# Patient Record
Sex: Female | Born: 1985 | Hispanic: No | Marital: Married | Smoking: Never smoker
Health system: Southern US, Community
[De-identification: ages and names within clinical notes are randomized; demographics above are authoritative.]

## PROBLEM LIST (undated history)

## (undated) ENCOUNTER — Inpatient Hospital Stay: Payer: Self-pay

## (undated) DIAGNOSIS — E039 Hypothyroidism, unspecified: Secondary | ICD-10-CM

## (undated) HISTORY — PX: APPENDECTOMY: SHX54

---

## 2016-04-08 ENCOUNTER — Observation Stay
Admission: EM | Admit: 2016-04-08 | Discharge: 2016-04-08 | Disposition: A | Discharge status: Home / Self Care | Payer: Medicaid Other | Attending: Obstetrics and Gynecology | Admitting: Obstetrics and Gynecology

## 2016-04-08 ENCOUNTER — Encounter: Payer: Self-pay | Admitting: *Deleted

## 2016-04-08 DIAGNOSIS — O368130 Decreased fetal movements, third trimester, not applicable or unspecified: Secondary | ICD-10-CM | POA: Diagnosis present

## 2016-04-08 DIAGNOSIS — E039 Hypothyroidism, unspecified: Secondary | ICD-10-CM | POA: Insufficient documentation

## 2016-04-08 DIAGNOSIS — Z3A34 34 weeks gestation of pregnancy: Secondary | ICD-10-CM | POA: Insufficient documentation

## 2016-04-08 NOTE — OB Triage Note (Signed)
Presents with complaint of no fetal movement since yesterday afternoon and pain in upper abdomen that comes and goes. Denies bleeding or leaking of fluid.

## 2016-04-08 NOTE — Final Progress Note (Signed)
Physician Final Progress Note  Patient ID: Ashley Hunter MRN: [id number] DOB/AGE: 11-17-1985 30 y.o.  Admit date: 04/08/2016 Admitting provider: Conard NovakJackson, Versa Craton D, MD  Discharge date: 04/08/2016   Admission Diagnoses:  1) intrauterine pregnancy at 4275w3d  2) decreased fetal movement, 3rd trimester  Discharge Diagnoses:  1) intrauterine pregnancy at 3175w3d  2) reactive antenatal testing  History of Present Illness: The patient is a 30 y.o. female G1P0 at 7275w3d who presents for decreased fetal movement.  Denies contractions, vaginal bleeding, and leakage of fluid.  No obvious movement since yesterday.  Hospital Course: The patient had a reactive NST while at the hospital and during this time the patient began noticing good fetal movement again. She was discharged with reassurance and precautions.   Past Medical History:   Hypothyroid  Past Surgical History:  Appendectomy  Medications: levothyroxine 250mcg  Allergies: No Known Allergies  Physical Exam: BP 117/70   Pulse 94   Temp 97.3 F (36.3 C) (Oral)   Resp 18    Consults: None  Significant Findings/ Diagnostic Studies: None  Procedures: NST Baseline: 130 Variability: moderate Accelerations: present Decelerations: infrequent variable decelerations.  The overall tracing is with many accelerations and moderate variability.  The small variables noted were very shallow and short with quick return to baseline. Tocometry: mild irritability   Discharge Condition: stable  Disposition: Final discharge disposition not confirmed  Diet: Regular diet  Discharge Activity: Activity as tolerated   Allergies as of 04/08/2016   No Known Allergies     Medication List    You have not been prescribed any medications.    Follow-up Information    [facility] Follow up on 04/17/2016.   Why:  call doctor or return to labor and delivery for any concerns Contact information: [address] [place] [address] [phone]           Total time spent taking care of this patient: 30 minutes  Signed: Conard NovakJackson, Karam Dunson D, MD  04/08/2016, 2:42 PM

## 2016-04-08 NOTE — Discharge Summary (Signed)
  See FPN 

## 2016-04-17 ENCOUNTER — Observation Stay
Admission: EM | Admit: 2016-04-17 | Discharge: 2016-04-18 | Disposition: A | Discharge status: Home / Self Care | Payer: Medicaid Other | Attending: Advanced Practice Midwife | Admitting: Advanced Practice Midwife

## 2016-04-17 ENCOUNTER — Encounter: Payer: Self-pay | Admitting: *Deleted

## 2016-04-17 DIAGNOSIS — O4103X0 Oligohydramnios, third trimester, not applicable or unspecified: Secondary | ICD-10-CM | POA: Diagnosis present

## 2016-04-17 DIAGNOSIS — Z3A35 35 weeks gestation of pregnancy: Secondary | ICD-10-CM | POA: Diagnosis not present

## 2016-04-17 DIAGNOSIS — O4103X1 Oligohydramnios, third trimester, fetus 1: Secondary | ICD-10-CM | POA: Diagnosis present

## 2016-04-17 HISTORY — DX: Hypothyroidism, unspecified: E03.9

## 2016-04-17 NOTE — H&P (Signed)
OB History & Physical   History of Present Illness:  Chief Complaint: sent from office for low amniotic fluid  HPI:  Ashley Hunter is a 30 y.o. G1P0 female at 6343w5d dated by LMP consistent with 11 week ultrasound.  Her pregnancy has been complicated by hypothyroidism and BMI > 30.  .    She denies contractions.   She denies leakage of fluid.   She denies vaginal bleeding.   She reports fetal movement.    Maternal Medical History:   Past Medical History:  Diagnosis Date  . Hypothyroidism     Past Surgical History:  Procedure Laterality Date  . APPENDECTOMY      No Known Allergies  Prior to Admission medications   Medication Sig Start Date End Date Taking? Authorizing Provider  levothyroxine (SYNTHROID, LEVOTHROID) 100 MCG tablet Take 100 mcg by mouth daily before breakfast.   Yes Historical Provider, MD  Prenatal Vit-Fe Fumarate-FA (PRENATAL MULTIVITAMIN) TABS tablet Take 1 tablet by mouth daily at 12 noon.   Yes Historical Provider, MD    OB History  Gravida Para Term Preterm AB Living  1            SAB TAB Ectopic Multiple Live Births               # Outcome Date GA Lbr Len/2nd Weight Sex Delivery Anes PTL Lv  1 Current               Prenatal care site: [facility]  Social History: She  reports that she has never smoked. She has never used smokeless tobacco. She reports that she does not drink alcohol or use drugs.  Family History: family history includes Diabetes in her paternal grandfather.   Review of Systems: Negative x 10 systems reviewed except as noted in the HPI.    Physical Exam:  Vital Signs: BP 110/72   Pulse 81   Temp 97.7 F (36.5 C) (Oral)  General: no acute distress.  HEENT: normocephalic, atraumatic Heart: regular rate & rhythm.  No murmurs/rubs/gallops Lungs: clear to auscultation bilaterally Abdomen: soft, gravid, non-tender;  EFW: 5 lb 2 oz. Pelvic: declined Extremities: non-tender, symmetric, no edema bilaterally.   DTRs: 2+  Neurologic: Alert & oriented x 3.    Pertinent Results:  Prenatal Labs: Blood type/Rh B positive  Antibody screen negative  Rubella Immune  Varicella Immune    RPR NR  HBsAg negative  HIV negative  GC negative  Chlamydia negative  Genetic screening declined  1 hour GTT Early 129, 28 week 140 (passed 2 hour gtt with only fasting abnormal - 125)  3 hour GTT See above  GBS unknown   Baseline FHR: 130 beats/min   Variability: moderate   Accelerations: present   Decelerations: absent Contractions: absent frequency: n/a Overall assessment: category 1  Bedside Ultrasound: (performed in clinic today)  Number of Fetus: 1  Presentation: breech  Fluid: 3.6 cm (1 > 2cm pocket of fluid)  Placental Location: posterior  Assessment:  Ashley PewHarshana Jayeshkumar Goslin is a 30 y.o. G1P0 female at 7443w5d with oligomenorrhea..   Plan:  1. Admit to Labor & Delivery for observation. 2. Fetwal well-being: reassuring 3. Oligohydramnios: 1. Hydrate overnight 2. Repeat AFI late morning/early afternoon 3. Based on definition of oligohydramnios (AFI <5.0 or single deepest vertical pocket < 2.0); she technically has one pocket of >2.0 cm of fluid. Delivery consideration to be based on tomorrow's evaluation.  From ACOG, recommendation is to try to get to 7736  and if possible 37 weeks prior to delivery.   4. Delivery mode will likely have to be by cesarean.   5. All questions answered.   Conard NovakJackson, Tiarah Shisler D, MD 04/17/2016 8:07 PM

## 2016-04-17 NOTE — OB Triage Note (Signed)
G1P0 presents at 7433w5d for NST for low AFI in office.

## 2016-04-17 NOTE — Plan of Care (Signed)
Report to next RN/C Zia Najera RNC 

## 2016-04-18 ENCOUNTER — Observation Stay: Payer: Medicaid Other

## 2016-04-18 DIAGNOSIS — O4103X0 Oligohydramnios, third trimester, not applicable or unspecified: Secondary | ICD-10-CM | POA: Diagnosis not present

## 2016-04-18 NOTE — Discharge Summary (Signed)
Pt is a 30 yo G1 with EDD of 05/17/16 admitted yesterday for IV hydration at 6333w5d with AFI of 3.6cm. AFI this am up to 4.7 cm with MVP 2.1cm. Dr Tiburcio PeaHarris in to speak with pt re: plan of management. As fetal status is very reassuring and AFI improved slightly with MVP >2cm, will continue to monitor closely with AFIs every 48 hours. Plan for repeat AFI on 12/29 at office. Dr Tiburcio PeaHarris reviewed importance of [facility] and pt was instructed to return to hospital with any change in movement. Also reviewed labor precautions, return with contractions, LOF or vaginal bleeding. Pt in agreement with plan of management. Will be d/c home soon.

## 2016-04-22 ENCOUNTER — Inpatient Hospital Stay
Admission: EM | Admit: 2016-04-22 | Discharge: 2016-04-22 | Disposition: A | Discharge status: Home / Self Care | Payer: Medicaid Other | Attending: Obstetrics & Gynecology | Admitting: Obstetrics & Gynecology

## 2016-04-22 DIAGNOSIS — Z0371 Encounter for suspected problem with amniotic cavity and membrane ruled out: Secondary | ICD-10-CM | POA: Diagnosis present

## 2016-04-22 DIAGNOSIS — Z3A36 36 weeks gestation of pregnancy: Secondary | ICD-10-CM | POA: Insufficient documentation

## 2016-04-22 DIAGNOSIS — O321XX0 Maternal care for breech presentation, not applicable or unspecified: Secondary | ICD-10-CM | POA: Insufficient documentation

## 2016-04-22 NOTE — Final Progress Note (Signed)
Physician Final Progress Note  Patient ID: Ashley Hunter MRN: [id number] DOB/AGE: 1985-10-07 30 y.o.  Admit date: 04/22/2016 Admitting provider: Antionette CharLisa Jackson-Moore, MD Discharge date: 04/22/2016   Admission Diagnoses: IUP at 36.3 weeks with borderline low amniotic fluid Breech presentation  Discharge Diagnoses:  Same as above Reactive NST Normal amniotic fluid  Consults: none  Significant Findings/ Diagnostic Studies: 30 yo G1 P0 female with EDC=05/17/2016 presented at 36 wk 3d  for a NST and AFI. She had a AFI on 04/17/2016 of 3.6cm and received IV hydration on L&D. Repeat AFI 12/27=4.7cm with one pocket>2cm. On 12/29 her AFI=3.8cm with one pocket>2cm. Non stress tests have remained reactive. Baby has been in the breech presentation and she will need a Cesarean section for delivery. Nonstress test today is nicely reactive with baseline 120s and accelerations to 150s to 180s, moderate variability. AFI=1.48cm+ 3..09cm+ 1.23 cm= 5.8cm. Baby is still in breech presentation. Patient is tentatively scheduled for a Cesarean section on 5 January. Follow up for a non stress test and AFI on 3 January [facility].  Procedures: Non stress test and AFI  Discharge Condition: stable  Disposition: 01-Home or Self Care  Diet: Regular diet  Discharge Activity: Activity as tolerated     Total time spent taking care of this patient: 15 minutes  Signed: Jasara Corrigan 04/22/2016, 10:57 AM

## 2016-04-22 NOTE — OB Triage Note (Signed)
Here for repeat NST and AFI. Ashley HoopsElks, [name]

## 2016-04-25 ENCOUNTER — Other Ambulatory Visit (HOSPITAL_COMMUNITY): Payer: Self-pay | Admitting: Pharmacy Technician

## 2016-04-26 ENCOUNTER — Encounter
Admission: RE | Admit: 2016-04-26 | Discharge: 2016-04-26 | Disposition: A | Discharge status: Home / Self Care | Payer: Medicaid Other | Source: Ambulatory Visit | Attending: Obstetrics and Gynecology | Admitting: Obstetrics and Gynecology

## 2016-04-26 DIAGNOSIS — Z01812 Encounter for preprocedural laboratory examination: Secondary | ICD-10-CM | POA: Insufficient documentation

## 2016-04-26 DIAGNOSIS — O321XX0 Maternal care for breech presentation, not applicable or unspecified: Secondary | ICD-10-CM

## 2016-04-26 DIAGNOSIS — Z0183 Encounter for blood typing: Secondary | ICD-10-CM | POA: Insufficient documentation

## 2016-04-26 DIAGNOSIS — Z3A00 Weeks of gestation of pregnancy not specified: Secondary | ICD-10-CM

## 2016-04-26 LAB — CBC
HCT: 36.6 % (ref 35.0–47.0)
Hemoglobin: 12.5 g/dL (ref 12.0–16.0)
MCH: 29.6 pg (ref 26.0–34.0)
MCHC: 34.2 g/dL (ref 32.0–36.0)
MCV: 86.6 fL (ref 80.0–100.0)
PLATELETS: 247 10*3/uL (ref 150–440)
RBC: 4.22 MIL/uL (ref 3.80–5.20)
RDW: 13.6 % (ref 11.5–14.5)
WBC: 10.7 10*3/uL (ref 3.6–11.0)

## 2016-04-26 LAB — RAPID HIV SCREEN (HIV 1/2 AB+AG)
HIV 1/2 Antibodies: NONREACTIVE
HIV-1 P24 Antigen - HIV24: NONREACTIVE

## 2016-04-26 LAB — TYPE AND SCREEN
ABO/RH(D): B POS
Antibody Screen: NEGATIVE
EXTEND SAMPLE REASON: UNDETERMINED

## 2016-04-26 NOTE — Patient Instructions (Signed)
Your procedure is scheduled on: 04/28/15 Report to [facility] 3RD FLOOR VISITOR ENTRANCE AT 10:00 AM   Remember: Instructions that are not followed completely may result in serious medical risk, up to and including death, or upon the discretion of your surgeon and anesthesiologist your surgery may need to be rescheduled.    __X__ 1. Do not eat food or drink liquids after midnight. No gum chewing or hard candies.     __X__ 2. No Alcohol for 24 hours before or after surgery.   ____ 3. Bring all medications with you on the day of surgery if instructed.    __X__ 4. Notify your doctor if there is any change in your medical condition     (cold, fever, infections).             __X___5. No smoking within 24 hours of your surgery.     Do not wear jewelry, make-up, hairpins, clips or nail polish.  Do not wear lotions, powders, or perfumes.   Do not shave 48 hours prior to surgery. Men may shave face and neck.  Do not bring valuables to the hospital.    [facility] is not responsible for any belongings or valuables.               Contacts, dentures or bridgework may not be worn into surgery.  Leave your suitcase in the car. After surgery it may be brought to your room.  For patients admitted to the hospital, discharge time is determined by your                treatment team.   Patients discharged the day of surgery will not be allowed to drive home.   Please read over the following fact sheets that you were given:   Pain Booklet and MRSA Information   __X__ Take these medicines the morning of surgery with A SIP OF WATER:    1. LEVOTHYROXINE  2.   3.   4.  5.  6.  ____ Fleet Enema (as directed)   __X__ Use CHG Soap as directed  ____ Use inhalers on the day of surgery  ____ Stop metformin 2 days prior to surgery    ____ Take 1/2 of usual insulin dose the night before surgery and none on the morning of surgery.   ____ Stop Coumadin/Plavix/aspirin on   __X__ Stop  Anti-inflammatories such as Advil, Aleve, Ibuprofen, Motrin, Naproxen, Naprosyn, Goodies,powder, or aspirin products.  OK to take Tylenol.   ____ Stop supplements until after surgery.    ____ Bring C-Pap to the hospital.

## 2016-04-27 ENCOUNTER — Encounter
Admission: RE | Disposition: A | Discharge status: Home / Self Care | Payer: Self-pay | Source: Ambulatory Visit | Attending: Obstetrics and Gynecology

## 2016-04-27 ENCOUNTER — Inpatient Hospital Stay
Admission: RE | Admit: 2016-04-27 | Discharge: 2016-04-30 | DRG: 765 | Disposition: A | Discharge status: Home / Self Care | Payer: Medicaid Other | Source: Ambulatory Visit | Attending: Obstetrics and Gynecology | Admitting: Obstetrics and Gynecology

## 2016-04-27 ENCOUNTER — Observation Stay: Payer: Medicaid Other | Admitting: Anesthesiology

## 2016-04-27 DIAGNOSIS — E039 Hypothyroidism, unspecified: Secondary | ICD-10-CM | POA: Diagnosis present

## 2016-04-27 DIAGNOSIS — O9081 Anemia of the puerperium: Secondary | ICD-10-CM | POA: Diagnosis not present

## 2016-04-27 DIAGNOSIS — O99284 Endocrine, nutritional and metabolic diseases complicating childbirth: Secondary | ICD-10-CM | POA: Diagnosis present

## 2016-04-27 DIAGNOSIS — O4100X0 Oligohydramnios, unspecified trimester, not applicable or unspecified: Secondary | ICD-10-CM | POA: Diagnosis present

## 2016-04-27 DIAGNOSIS — Z98891 History of uterine scar from previous surgery: Secondary | ICD-10-CM

## 2016-04-27 DIAGNOSIS — D62 Acute posthemorrhagic anemia: Secondary | ICD-10-CM | POA: Diagnosis not present

## 2016-04-27 DIAGNOSIS — O4103X0 Oligohydramnios, third trimester, not applicable or unspecified: Secondary | ICD-10-CM | POA: Diagnosis present

## 2016-04-27 DIAGNOSIS — Z3A37 37 weeks gestation of pregnancy: Secondary | ICD-10-CM | POA: Diagnosis not present

## 2016-04-27 DIAGNOSIS — O321XX0 Maternal care for breech presentation, not applicable or unspecified: Secondary | ICD-10-CM | POA: Diagnosis present

## 2016-04-27 LAB — RPR: RPR Ser Ql: NONREACTIVE

## 2016-04-27 LAB — ABO/RH: ABO/RH(D): B POS

## 2016-04-27 SURGERY — Surgical Case
Anesthesia: Spinal | Site: Abdomen | Wound class: Clean

## 2016-04-27 MED ORDER — LEVOTHYROXINE SODIUM 50 MCG PO TABS
100.0000 ug | ORAL_TABLET | Freq: Every day | ORAL | Status: DC
  Administered 2016-04-28 – 2016-04-30 (×3): 100 ug via ORAL
  Filled 2016-04-27 (×3): qty 2

## 2016-04-27 MED ORDER — OXYCODONE HCL 5 MG PO TABS
10.0000 mg | ORAL_TABLET | ORAL | Status: DC | PRN
  Administered 2016-04-28: 10 mg via ORAL
  Filled 2016-04-27 (×3): qty 2

## 2016-04-27 MED ORDER — SCOPOLAMINE 1 MG/3DAYS TD PT72: 1.0000 | MEDICATED_PATCH | Freq: Once | TRANSDERMAL | Status: DC

## 2016-04-27 MED ORDER — OXYCODONE-ACETAMINOPHEN 5-325 MG PO TABS: 2.0000 | ORAL_TABLET | ORAL | Status: DC | PRN

## 2016-04-27 MED ORDER — CEFAZOLIN SODIUM-DEXTROSE 2-4 GM/100ML-% IV SOLN
2.0000 g | INTRAVENOUS | Status: AC
  Administered 2016-04-27: 2 g via INTRAVENOUS
  Filled 2016-04-27 (×2): qty 100

## 2016-04-27 MED ORDER — ONDANSETRON HCL 4 MG/2ML IJ SOLN
INTRAMUSCULAR | Status: DC | PRN
  Administered 2016-04-27: 4 mg via INTRAVENOUS

## 2016-04-27 MED ORDER — OXYTOCIN 40 UNITS IN LACTATED RINGERS INFUSION - SIMPLE MED
INTRAVENOUS | Status: DC | PRN
  Administered 2016-04-27: 700 mL via INTRAVENOUS

## 2016-04-27 MED ORDER — SIMETHICONE 80 MG PO CHEW
80.0000 mg | CHEWABLE_TABLET | Freq: Three times a day (TID) | ORAL | Status: DC
  Administered 2016-04-27 – 2016-04-30 (×8): 80 mg via ORAL
  Filled 2016-04-27 (×9): qty 1

## 2016-04-27 MED ORDER — SODIUM CHLORIDE 0.9% FLUSH: 3.0000 mL | INTRAVENOUS | Status: DC | PRN

## 2016-04-27 MED ORDER — FENTANYL CITRATE (PF) 100 MCG/2ML IJ SOLN
INTRAMUSCULAR | Status: DC | PRN
  Administered 2016-04-27: 15 ug via INTRATHECAL

## 2016-04-27 MED ORDER — OXYTOCIN 40 UNITS IN LACTATED RINGERS INFUSION - SIMPLE MED
2.5000 [IU]/h | INTRAVENOUS | Status: AC
Start: 2016-04-27 — End: 2016-04-28
  Filled 2016-04-27: qty 1000

## 2016-04-27 MED ORDER — FENTANYL CITRATE (PF) 100 MCG/2ML IJ SOLN: 25.0000 ug | INTRAMUSCULAR | Status: DC | PRN

## 2016-04-27 MED ORDER — ONDANSETRON HCL 4 MG/2ML IJ SOLN
INTRAMUSCULAR | Status: AC
  Filled 2016-04-27: qty 2

## 2016-04-27 MED ORDER — BUPIVACAINE HCL 0.5 % IJ SOLN
INTRAMUSCULAR | Status: DC | PRN
  Administered 2016-04-27: 10 mL

## 2016-04-27 MED ORDER — BUPIVACAINE HCL (PF) 0.5 % IJ SOLN: 5.0000 mL | Freq: Once | INTRAMUSCULAR | Status: DC

## 2016-04-27 MED ORDER — PRENATAL MULTIVITAMIN CH: 1.0000 | ORAL_TABLET | Freq: Every day | ORAL | Status: DC

## 2016-04-27 MED ORDER — ACETAMINOPHEN 325 MG PO TABS
650.0000 mg | ORAL_TABLET | Freq: Four times a day (QID) | ORAL | Status: DC
  Administered 2016-04-27 – 2016-04-30 (×9): 650 mg via ORAL
  Filled 2016-04-27 (×11): qty 2

## 2016-04-27 MED ORDER — PRENATAL MULTIVITAMIN CH
1.0000 | ORAL_TABLET | Freq: Every day | ORAL | Status: DC
  Administered 2016-04-28 – 2016-04-29 (×2): 1 via ORAL
  Filled 2016-04-27 (×3): qty 1

## 2016-04-27 MED ORDER — BUPIVACAINE HCL (PF) 0.5 % IJ SOLN
5.0000 mL | Freq: Once | INTRAMUSCULAR | Status: DC
  Filled 2016-04-27: qty 30

## 2016-04-27 MED ORDER — OXYCODONE-ACETAMINOPHEN 5-325 MG PO TABS: 1.0000 | ORAL_TABLET | ORAL | Status: DC | PRN

## 2016-04-27 MED ORDER — FENTANYL CITRATE (PF) 100 MCG/2ML IJ SOLN
INTRAMUSCULAR | Status: AC
  Filled 2016-04-27: qty 2

## 2016-04-27 MED ORDER — ONDANSETRON HCL 4 MG/2ML IJ SOLN: 4.0000 mg | Freq: Three times a day (TID) | INTRAMUSCULAR | Status: DC | PRN

## 2016-04-27 MED ORDER — DIPHENHYDRAMINE HCL 50 MG/ML IJ SOLN: 12.5000 mg | INTRAMUSCULAR | Status: DC | PRN

## 2016-04-27 MED ORDER — ONDANSETRON HCL 4 MG/2ML IJ SOLN: 4.0000 mg | Freq: Once | INTRAMUSCULAR | Status: DC | PRN

## 2016-04-27 MED ORDER — LACTATED RINGERS IV SOLN
INTRAVENOUS | Status: DC
  Administered 2016-04-27 – 2016-04-28 (×2): via INTRAVENOUS

## 2016-04-27 MED ORDER — NALBUPHINE HCL 10 MG/ML IJ SOLN: 5.0000 mg | INTRAMUSCULAR | Status: DC | PRN

## 2016-04-27 MED ORDER — BUPIVACAINE IN DEXTROSE 0.75-8.25 % IT SOLN
INTRATHECAL | Status: DC | PRN
  Administered 2016-04-27: 1.6 mL via INTRATHECAL

## 2016-04-27 MED ORDER — IBUPROFEN 600 MG PO TABS: 600.0000 mg | ORAL_TABLET | Freq: Four times a day (QID) | ORAL | Status: DC | PRN

## 2016-04-27 MED ORDER — FERROUS SULFATE 325 (65 FE) MG PO TABS
325.0000 mg | ORAL_TABLET | Freq: Two times a day (BID) | ORAL | Status: DC
Start: 2016-04-27 — End: 2016-04-30
  Administered 2016-04-27 – 2016-04-30 (×6): 325 mg via ORAL
  Filled 2016-04-27 (×6): qty 1

## 2016-04-27 MED ORDER — NALOXONE HCL 0.4 MG/ML IJ SOLN: 0.4000 mg | INTRAMUSCULAR | Status: DC | PRN

## 2016-04-27 MED ORDER — MEPERIDINE HCL 25 MG/ML IJ SOLN: 6.2500 mg | INTRAMUSCULAR | Status: DC | PRN

## 2016-04-27 MED ORDER — MORPHINE SULFATE (PF) 0.5 MG/ML IJ SOLN
INTRAMUSCULAR | Status: AC
  Filled 2016-04-27: qty 10

## 2016-04-27 MED ORDER — NALOXONE HCL 2 MG/2ML IJ SOSY
1.0000 ug/kg/h | PREFILLED_SYRINGE | INTRAVENOUS | Status: DC | PRN
  Filled 2016-04-27: qty 2

## 2016-04-27 MED ORDER — SENNOSIDES-DOCUSATE SODIUM 8.6-50 MG PO TABS
2.0000 | ORAL_TABLET | ORAL | Status: DC
  Administered 2016-04-30: 2 via ORAL
  Filled 2016-04-27 (×2): qty 2

## 2016-04-27 MED ORDER — MENTHOL 3 MG MT LOZG
1.0000 | LOZENGE | OROMUCOSAL | Status: DC | PRN
  Filled 2016-04-27: qty 9

## 2016-04-27 MED ORDER — DIBUCAINE 1 % RE OINT: 1.0000 "application " | TOPICAL_OINTMENT | RECTAL | Status: DC | PRN

## 2016-04-27 MED ORDER — NALBUPHINE HCL 10 MG/ML IJ SOLN: 5.0000 mg | Freq: Once | INTRAMUSCULAR | Status: DC | PRN

## 2016-04-27 MED ORDER — LACTATED RINGERS IV SOLN: INTRAVENOUS | Status: DC

## 2016-04-27 MED ORDER — MORPHINE SULFATE (PF) 0.5 MG/ML IJ SOLN
INTRAMUSCULAR | Status: DC | PRN
  Administered 2016-04-27: .1 mg via INTRATHECAL

## 2016-04-27 MED ORDER — DIPHENHYDRAMINE HCL 25 MG PO CAPS: 25.0000 mg | ORAL_CAPSULE | Freq: Four times a day (QID) | ORAL | Status: DC | PRN

## 2016-04-27 MED ORDER — BUPIVACAINE ON-Q PAIN PUMP (FOR ORDER SET NO CHG)
INJECTION | Status: DC
  Filled 2016-04-27: qty 1

## 2016-04-27 MED ORDER — DIPHENHYDRAMINE HCL 25 MG PO CAPS: 25.0000 mg | ORAL_CAPSULE | ORAL | Status: DC | PRN

## 2016-04-27 MED ORDER — SOD CITRATE-CITRIC ACID 500-334 MG/5ML PO SOLN
30.0000 mL | ORAL | Status: AC
  Administered 2016-04-27: 30 mL via ORAL
  Filled 2016-04-27: qty 15

## 2016-04-27 MED ORDER — LACTATED RINGERS IV SOLN
INTRAVENOUS | Status: DC | PRN
Start: 2016-04-27 — End: 2016-04-27
  Administered 2016-04-27: 12:00:00 via INTRAVENOUS

## 2016-04-27 MED ORDER — IBUPROFEN 600 MG PO TABS: 600.0000 mg | ORAL_TABLET | Freq: Four times a day (QID) | ORAL | Status: DC

## 2016-04-27 MED ORDER — IBUPROFEN 600 MG PO TABS
600.0000 mg | ORAL_TABLET | Freq: Four times a day (QID) | ORAL | Status: DC
  Administered 2016-04-27 – 2016-04-30 (×10): 600 mg via ORAL
  Filled 2016-04-27 (×12): qty 1

## 2016-04-27 MED ORDER — LACTATED RINGERS IV BOLUS (SEPSIS)
1000.0000 mL | Freq: Once | INTRAVENOUS | Status: AC
  Administered 2016-04-27: 1000 mL via INTRAVENOUS

## 2016-04-27 MED ORDER — WITCH HAZEL-GLYCERIN EX PADS: 1.0000 "application " | MEDICATED_PAD | CUTANEOUS | Status: DC | PRN

## 2016-04-27 MED ORDER — COCONUT OIL OIL: 1.0000 "application " | TOPICAL_OIL | Status: DC | PRN

## 2016-04-27 MED ORDER — EPHEDRINE SULFATE 50 MG/ML IJ SOLN
INTRAMUSCULAR | Status: DC | PRN
  Administered 2016-04-27 (×2): 10 mg via INTRAVENOUS

## 2016-04-27 MED ORDER — PHENYLEPHRINE HCL 10 MG/ML IJ SOLN
INTRAMUSCULAR | Status: AC
  Filled 2016-04-27: qty 1

## 2016-04-27 MED ORDER — PHENYLEPHRINE HCL 10 MG/ML IJ SOLN
INTRAMUSCULAR | Status: DC | PRN
  Administered 2016-04-27 (×5): 100 ug via INTRAVENOUS
  Administered 2016-04-27: 50 ug via INTRAVENOUS
  Administered 2016-04-27 (×3): 100 ug via INTRAVENOUS

## 2016-04-27 MED ORDER — OXYCODONE HCL 5 MG PO TABS
5.0000 mg | ORAL_TABLET | ORAL | Status: DC | PRN
  Administered 2016-04-28 – 2016-04-30 (×2): 5 mg via ORAL
  Filled 2016-04-27 (×2): qty 1

## 2016-04-27 MED ORDER — BUPIVACAINE 0.25 % ON-Q PUMP DUAL CATH 400 ML
400.0000 mL | INJECTION | Status: DC
  Filled 2016-04-27: qty 400

## 2016-04-27 SURGICAL SUPPLY — 30 items
CANISTER SUCT 3000ML (MISCELLANEOUS) ×2 IMPLANT
CATH KIT ON-Q SILVERSOAK 5IN (CATHETERS) ×4 IMPLANT
DERMABOND ADVANCED (GAUZE/BANDAGES/DRESSINGS) ×1
DERMABOND ADVANCED .7 DNX12 (GAUZE/BANDAGES/DRESSINGS) ×1 IMPLANT
DRSG OPSITE POSTOP 4X10 (GAUZE/BANDAGES/DRESSINGS) ×2 IMPLANT
DRSG TELFA 3X8 NADH (GAUZE/BANDAGES/DRESSINGS) ×2 IMPLANT
ELECT CAUTERY BLADE 6.4 (BLADE) ×2 IMPLANT
ELECT REM PT RETURN 9FT ADLT (ELECTROSURGICAL) ×2
ELECTRODE REM PT RTRN 9FT ADLT (ELECTROSURGICAL) ×1 IMPLANT
GAUZE SPONGE 4X4 12PLY STRL (GAUZE/BANDAGES/DRESSINGS) ×2 IMPLANT
GLOVE BIO SURGEON STRL SZ7 (GLOVE) ×2 IMPLANT
GLOVE INDICATOR 7.5 STRL GRN (GLOVE) ×2 IMPLANT
GOWN STRL REUS W/ TWL LRG LVL3 (GOWN DISPOSABLE) ×3 IMPLANT
GOWN STRL REUS W/TWL LRG LVL3 (GOWN DISPOSABLE) ×3
LIQUID BAND (GAUZE/BANDAGES/DRESSINGS) ×2 IMPLANT
NS IRRIG 1000ML POUR BTL (IV SOLUTION) ×2 IMPLANT
PACK C SECTION AR (MISCELLANEOUS) ×2 IMPLANT
PAD OB MATERNITY 4.3X12.25 (PERSONAL CARE ITEMS) ×4 IMPLANT
PAD PREP 24X41 OB/GYN DISP (PERSONAL CARE ITEMS) ×2 IMPLANT
SPONGE LAP 18X18 5 PK (GAUZE/BANDAGES/DRESSINGS) ×4 IMPLANT
STRIP CLOSURE SKIN 1/2X4 (GAUZE/BANDAGES/DRESSINGS) ×2 IMPLANT
SUT CHROMIC GUT BROWN 0 54 (SUTURE) ×1 IMPLANT
SUT CHROMIC GUT BROWN 0 54IN (SUTURE) ×2
SUT MNCRL 4-0 (SUTURE) ×1
SUT MNCRL 4-0 27XMFL (SUTURE) ×1
SUT PDS AB 1 TP1 96 (SUTURE) ×2 IMPLANT
SUT PLAIN 2 0 XLH (SUTURE) ×2 IMPLANT
SUT VIC AB 0 CT1 36 (SUTURE) ×8 IMPLANT
SUTURE MNCRL 4-0 27XMF (SUTURE) ×1 IMPLANT
SWABSTK COMLB BENZOIN TINCTURE (MISCELLANEOUS) ×2 IMPLANT

## 2016-04-27 NOTE — Anesthesia Procedure Notes (Signed)
Spinal  Patient location during procedure: OR Staffing Anesthesiologist: Marline Backbone F Resident/CRNA: Rolla Plate Performed: resident/CRNA  Preanesthetic Checklist Completed: patient identified, site marked, surgical consent, pre-op evaluation, timeout performed, IV checked, risks and benefits discussed and monitors and equipment checked Spinal Block Patient position: sitting Prep: ChloraPrep and site prepped and draped Patient monitoring: heart rate, continuous pulse ox, blood pressure and cardiac monitor Approach: midline Location: L4-5 Injection technique: single-shot Needle Needle type: Introducer and Pencan  Needle gauge: 24 G Needle length: 9 cm Additional Notes Negative paresthesia. Negative blood return. Positive free-flowing CSF. Expiration date of kit checked and confirmed. Patient tolerated procedure well, without complications.

## 2016-04-27 NOTE — Transfer of Care (Signed)
Immediate Anesthesia Transfer of Care Note  Patient: Ashley Hunter  Procedure(s) Performed: Procedure(s) with comments: CESAREAN SECTION FOR BREECH PRESENTATION (N/A) - Baby girl born @ 1300 Apgars: 8/9 Weight: 5lbs 9ozs  Patient Location: Mother/Baby  Anesthesia Type:Spinal  Level of Consciousness: awake and alert   Airway & Oxygen Therapy: Patient Spontanous Breathing  Post-op Assessment: Report given to RN and Post -op Vital signs reviewed and stable  Post vital signs: Reviewed  Last Vitals:  Vitals:   04/27/16 1030 04/27/16 1404  BP: 123/77 (!) 96/58  Pulse: 79 71  Resp: 16 18  Temp: 36.7 C 36.2 C    Last Pain:  Vitals:   04/27/16 1030  TempSrc: Oral  PainSc: 0-No pain         Complications: No apparent anesthesia complications

## 2016-04-27 NOTE — Op Note (Signed)
Cesarean Section Operative Note    Newt LukesHarshana J Boutin   04/27/2016   Pre-operative Diagnosis:  1) intrauterine pregnancy at 815w1d  2) breech presentation 3) oligohydramnios.   Post-operative Diagnosis:  1) intrauterine pregnancy at [redacted]w[redacted]d  2) breech presentation 3) oligohydramnios.   Procedure: Primary Low Transverse Cesarean Section via Pfannenstiel incision  Surgeon: Surgeon(s) and Role:    * Conard NovakStephen D Vinayak Bobier, MD - Primary   Assistants: Gwenith SpitzJaime Benson, RN  Anesthesia: spinal   Findings:  1) normal appearing gravid uterus, fallopian tubes, and ovaries 2) viable female infant with APGARs 8 and 9 and weight 2,530 grams   Estimated Blood Loss: 750 mL  Total IV Fluids: 1,300 ml crystalloid  Urine Output: 150 mL clear urine at end of procedure  Specimens: None  Complications: no complications  Disposition: PACU - hemodynamically stable.   Maternal Condition: stable   Baby condition / location:  Couplet care / Skin to Skin  Procedure Details:  The patient was seen in the Holding Room. The risks, benefits, complications, treatment options, and expected outcomes were discussed with the patient. The patient concurred with the proposed plan, giving informed consent. identified as Newt LukesHarshana J Umeda and the procedure verified as C-Section Delivery. A Time Out was held and the above information confirmed.   After induction of anesthesia, the patient was draped and prepped in the usual sterile manner. A Pfannenstiel incision was made and carried down through the subcutaneous tissue to the fascia. Fascial incision was made and extended transversely. The fascia was separated from the underlying rectus tissue superiorly and inferiorly. The peritoneum was identified and entered. Peritoneal incision was extended longitudinally. The bladder flap was not freed from the lower uterine segment. A low transverse uterine incision was made and the hysterotomy was extended with cranial-caudal tension.  Delivered from breech (frank) presentation was a 2,530 gram Living newborn infant(s)/Female (delivered by with Mauriceau Smellie veit maneuver with Apgar scores of 8 at one minute and 9 at five minutes. Cord ph was not sent the umbilical cord was clamped and cut cord blood was not obtained for evaluation. The placenta was removed Intact and appeared normal. The uterine outline, tubes and ovaries appeared normal. The uterine incision was closed with running locked sutures of 0 Vicryl.  A second layer of the same suture was thrown in an imbricating fashion.  Hemostasis was assured.  The uterus was returned to the abdomen and the paracolic gutters were cleared of all clots and debris.  The rectus muscles were inspected and found to be hemostatic.  The On-Q catheter pumps were inserted in accordance with the manufacturer's recommendations.  The catheters were inserted approximately 4cm cephelad to the incision line, approximately 1cm apart, straddling the midline.  They were inserted to a depth of the 4th mark. They were positioned superficial to the rectus abdominus muscles and deep to the rectus fascia.    The fascia was then reapproximated with running sutures of 1-0 PDS, looped. The subcuticular closure was performed using 4-0 monocryl. The skin closure was reinforced using surgical skin glue.  The On-Q catheters were bolused with 5 mL of 0.5% marcaine plain for a total of 10 mL.  The catheters were affixed to the skin with surgical skin glue, steri-strips, and tegaderm.    Instrument, sponge, and needle counts were correct prior the abdominal closure and were correct at the conclusion of the case.  The patient received Ancef 2 gram IV prior to skin incision (within 30 minutes). For VTE prophylaxis  she was wearing SCDs throughout the case.   Signed: Conard Novak, MD 04/27/2016 1:51 PM

## 2016-04-27 NOTE — Discharge Summary (Signed)
OB Discharge Summary     Patient Name: Ashley Hunter DOB: 1985/07/05 MRN: [id number]  Date of admission: 04/27/2016 Delivering MD: Conard NovakJackson, Stephen D, MD  Date of Delivery: 04/27/2016  Date of discharge: 04/30/2016  Admitting diagnosis: breech,oligohydramnios Intrauterine pregnancy: 6064w1d     Secondary diagnosis: Hypothyroid     Discharge diagnosis: Term Pregnancy Delivered                                                                                                Post partum procedures:none  Augmentation: N/A  Complications: None  Hospital course:  Sceduled C/S   31 y.o. yo G1P0 at 9764w1d was admitted to the hospital 04/27/2016 for scheduled cesarean section with the following indication:Malpresentation.  Membrane Rupture Time/Date:   ,    Patient delivered a Viable infant.04/27/2016  Details of operation can be found in separate operative note.  Pateint had an uncomplicated postpartum course.  She is ambulating, tolerating a regular diet, passing flatus, and urinating well. Patient is discharged home in stable condition on  04/30/16          Physical exam  Vitals:   04/29/16 2055 04/30/16 0018 04/30/16 0503 04/30/16 0805  BP: 134/60   110/75  Pulse: 87   85  Resp: 18     Temp: 98.7 F (37.1 C) 97.9 F (36.6 C) 97.7 F (36.5 C) 98 F (36.7 C)  TempSrc: Oral Oral Oral Oral  SpO2: 100%   99%  Weight:      Height:       General: alert, cooperative and no distress Lochia: appropriate Uterine Fundus: firm Incision: Healing well with no significant drainage DVT Evaluation: No evidence of DVT seen on physical exam. No cords or calf tenderness. No significant calf/ankle edema.  Labs: Lab Results  Component Value Date   WBC 10.0 04/28/2016   HGB 10.1 (L) 04/28/2016   HCT 29.1 (L) 04/28/2016   MCV 86.5 04/28/2016   PLT 178 04/28/2016   No flowsheet data found.  Discharge instruction: per After Visit Summary.  Medications:  Allergies as of 04/30/2016   No Known  Allergies     Medication List    STOP taking these medications   calcium carbonate 500 MG chewable tablet Commonly known as:  TUMS - dosed in mg elemental calcium     TAKE these medications   acetaminophen 325 MG tablet Commonly known as:  TYLENOL Take 2 tablets (650 mg total) by mouth every 6 (six) hours.   ibuprofen 600 MG tablet Commonly known as:  ADVIL,MOTRIN Take 1 tablet (600 mg total) by mouth every 6 (six) hours.   levothyroxine 100 MCG tablet Commonly known as:  SYNTHROID, LEVOTHROID Take 100 mcg by mouth daily before breakfast.   oxyCODONE 5 MG immediate release tablet Commonly known as:  Oxy IR/ROXICODONE Take 1 tablet (5 mg total) by mouth every 4 (four) hours as needed for moderate pain.   prenatal multivitamin Tabs tablet Take 1 tablet by mouth daily at 12 noon.   Tdap 5-2.5-18.5 LF-MCG/0.5 injection Commonly known as:  BOOSTRIX Inject 0.5 mLs into the muscle once.  Miralax per container instructions for constipation  Diet: routine diet  Activity: Advance as tolerated. Pelvic rest for 6 weeks.   Outpatient follow up: Follow-up Information    Conard Novak, MD Follow up in 1 week(s).   Specialty:  Obstetrics and Gynecology Why:  postop incision check Contact information: [address] [place] [address] [phone]             Postpartum contraception: Uncertain at discharge. Plans to consider options and discuss further at 1 week visit Rhogam Given postpartum: no Rubella vaccine given postpartum: no Varicella vaccine given postpartum: no TDaP given antepartum or postpartum: postpartum  Newborn Data: Live born female  Birth Weight: 5 lb 9.2 oz (2530 g) APGAR: 8, 9   Baby Feeding: Bottle and Breast  Disposition:home with mother  SIGNED: Tresea Mall, CNM

## 2016-04-27 NOTE — Anesthesia Preprocedure Evaluation (Signed)
Anesthesia Evaluation  Patient identified by MRN, date of birth, ID band Patient awake    Reviewed: Allergy & Precautions, NPO status , Patient's Chart, lab work & pertinent test results  Airway Mallampati: II       Dental  (+) Teeth Intact   Pulmonary neg pulmonary ROS,    breath sounds clear to auscultation       Cardiovascular Exercise Tolerance: Good  Rhythm:Regular     Neuro/Psych    GI/Hepatic negative GI ROS, Neg liver ROS,   Endo/Other  Hypothyroidism   Renal/GU negative Renal ROS     Musculoskeletal negative musculoskeletal ROS (+)   Abdominal Normal abdominal exam  (+)   Peds negative pediatric ROS (+)  Hematology negative hematology ROS (+)   Anesthesia Other Findings   Reproductive/Obstetrics                             Anesthesia Physical Anesthesia Plan  ASA: II  Anesthesia Plan: Spinal   Post-op Pain Management:    Induction:   Airway Management Planned: Natural Airway and Nasal Cannula  Additional Equipment:   Intra-op Plan:   Post-operative Plan:   Informed Consent: I have reviewed the patients History and Physical, chart, labs and discussed the procedure including the risks, benefits and alternatives for the proposed anesthesia with the patient or authorized representative who has indicated his/her understanding and acceptance.     Plan Discussed with: CRNA  Anesthesia Plan Comments:         Anesthesia Quick Evaluation

## 2016-04-27 NOTE — H&P (Signed)
History and Physical Interval Note:  Ashley Hunter  has presented today for surgery, with the diagnosis of breech,oligohydramnios  The various methods of treatment have been discussed with the patient and family. After consideration of risks, benefits and other options for treatment, the patient has consented to  Procedure(s): CESAREAN SECTION (N/A) as a surgical intervention .  The patient's history has been reviewed, patient examined, no change in status, stable for surgery.  I have reviewed the patient's chart and labs.  Questions were answered to the patient's satisfaction.  She notes +FM, no LOF, no ctx, no vaginal bleeding. A bedside ultrasound is performed and the baby is still in breech presentation with subjectively low fluid volume.   Will proceed with primary cesarean delivery.  Conard NovakJackson, Miryam Mcelhinney D, MD 04/27/2016 11:33 AM

## 2016-04-27 NOTE — Consult Note (Signed)
Neonatology Note:   Attendance at C-section:    I was asked by Dr. Jackson to attend this primary C/S at 37 1/7 weeks due to oligohydramnios and breech presentation. The mother is a G1P0 B pos, GBS not done with hypothyroidism (on Synthroid). Fasting blood glucose abnormal, but 2 hour GTT normal. ROM at delivery, fluid clear. Infant delivered frank breech, vigorous with good spontaneous cry and tone. Delayed cord clamping done for 45 seconds.  Needed only minimal bulb suctioning. Ap 8/9. Lungs clear to ausc in DR. To CN to care of Pediatrician.   Jenella Craigie C. Micaiah Remillard, MD 

## 2016-04-28 LAB — CBC
HEMATOCRIT: 29.1 % — AB (ref 35.0–47.0)
HEMOGLOBIN: 10.1 g/dL — AB (ref 12.0–16.0)
MCH: 29.8 pg (ref 26.0–34.0)
MCHC: 34.5 g/dL (ref 32.0–36.0)
MCV: 86.5 fL (ref 80.0–100.0)
Platelets: 178 10*3/uL (ref 150–440)
RBC: 3.37 MIL/uL — ABNORMAL LOW (ref 3.80–5.20)
RDW: 13.9 % (ref 11.5–14.5)
WBC: 10 10*3/uL (ref 3.6–11.0)

## 2016-04-28 NOTE — Anesthesia Postprocedure Evaluation (Signed)
Anesthesia Post Note  Patient: Ashley Hunter  Procedure(s) Performed: Procedure(s) (LRB): CESAREAN SECTION FOR BREECH PRESENTATION (N/A)  Patient location during evaluation: PACU Anesthesia Type: Spinal Level of consciousness: awake and alert Pain management: pain level controlled Vital Signs Assessment: post-procedure vital signs reviewed and stable Respiratory status: spontaneous breathing, nonlabored ventilation, respiratory function stable and patient connected to nasal cannula oxygen Cardiovascular status: blood pressure returned to baseline and stable Postop Assessment: no signs of nausea or vomiting Anesthetic complications: no     Last Vitals:  Vitals:   04/28/16 0359 04/28/16 0839  BP: 106/61 111/65  Pulse: 76 76  Resp: 18 18  Temp: 36.7 C 36.4 C    Last Pain:  Vitals:   04/28/16 0839  TempSrc: Oral  PainSc:                  Bluford Sedler S

## 2016-04-28 NOTE — Progress Notes (Signed)
  Subjective:  Doing well no concerns.  Pain well controlled on po analgesics.  Tolerating po.  Minimal lochia  Objective:  Blood pressure 111/65, pulse 76, temperature 97.6 F (36.4 C), temperature source Oral, resp. rate 18, height 5\' 1"  (1.549 m), weight 180 lb (81.6 kg), SpO2 100 %.  General: NAD Pulmonary: no increased work of breathing Abdomen: non-distended, non-tender, fundus firm at level of umbilicus Incision: D/C/I pressure dressing Extremities: no edema, no erythema, no tenderness  Results for orders placed or performed during the hospital encounter of 04/27/16 (from the past 72 hour(s))  ABO/Rh     Status: None   Collection Time: 04/27/16 11:01 AM  Result Value Ref Range   ABO/RH(D) B POS   CBC     Status: Abnormal   Collection Time: 04/28/16  5:51 AM  Result Value Ref Range   WBC 10.0 3.6 - 11.0 K/uL   RBC 3.37 (L) 3.80 - 5.20 MIL/uL   Hemoglobin 10.1 (L) 12.0 - 16.0 g/dL   HCT 78.229.1 (L) 95.635.0 - 21.347.0 %   MCV 86.5 80.0 - 100.0 fL   MCH 29.8 26.0 - 34.0 pg   MCHC 34.5 32.0 - 36.0 g/dL   RDW 08.613.9 57.811.5 - 46.914.5 %   Platelets 178 150 - 440 K/uL     Assessment:   31 y.o. G1P0 postoperativeday # 1 LTCS oligohydramnios and breech   Plan:  1) Acute blood loss anemia - hemodynamically stable and asymptomatic - po ferrous sulfate  2) --/--/B POS (01/05 1101) / Rubella Immune / Varicella Immune  3) TDAP status - verify UTD  4) Breast/undecided  5) Disposition anticipated POD2-3

## 2016-04-29 NOTE — Progress Notes (Signed)
  Subjective:   Doing well no concerns, minimal lochia  Objective:  Blood pressure (!) 123/50, pulse 77, temperature 98.3 F (36.8 C), temperature source Oral, resp. rate 18, height 5\' 1"  (1.549 m), weight 180 lb (81.6 kg), SpO2 100 %.  General: NAD Pulmonary: no increased work of breathing Abdomen: non-distended, non-tender, fundus firm at level of umbilicus Incision:D/C/I Extremities: no edema, no erythema, no tenderness  Results for orders placed or performed during the hospital encounter of 04/27/16 (from the past 72 hour(s))  ABO/Rh     Status: None   Collection Time: 04/27/16 11:01 AM  Result Value Ref Range   ABO/RH(D) B POS   CBC     Status: Abnormal   Collection Time: 04/28/16  5:51 AM  Result Value Ref Range   WBC 10.0 3.6 - 11.0 K/uL   RBC 3.37 (L) 3.80 - 5.20 MIL/uL   Hemoglobin 10.1 (L) 12.0 - 16.0 g/dL   HCT 16.129.1 (L) 09.635.0 - 04.547.0 %   MCV 86.5 80.0 - 100.0 fL   MCH 29.8 26.0 - 34.0 pg   MCHC 34.5 32.0 - 36.0 g/dL   RDW 40.913.9 81.111.5 - 91.414.5 %   Platelets 178 150 - 440 K/uL     Assessment:   31 y.o. G1P1001 postoperativeday # 2 LTCS, oligo and breech   Plan:  1) Acute blood loss anemia - hemodynamically stable and asymptomatic - po ferrous sulfate  2) --/--/B POS (01/05 1101) / RI / Varicella immune  3)  Breast/Undecided  4) Disposition anticipate discharge POD3

## 2016-04-30 ENCOUNTER — Encounter: Payer: Self-pay | Admitting: General Practice

## 2016-04-30 MED ORDER — OXYCODONE HCL 5 MG PO TABS: 5.0000 mg | ORAL_TABLET | ORAL | 0 refills | Status: DC | PRN

## 2016-04-30 MED ORDER — ACETAMINOPHEN 325 MG PO TABS: 650.0000 mg | ORAL_TABLET | Freq: Four times a day (QID) | ORAL | 0 refills | Status: DC

## 2016-04-30 MED ORDER — TETANUS-DIPHTH-ACELL PERTUSSIS 5-2.5-18.5 LF-MCG/0.5 IM SUSP
0.5000 mL | Freq: Once | INTRAMUSCULAR | Status: AC
  Administered 2016-04-30: 0.5 mL via INTRAMUSCULAR
  Filled 2016-04-30: qty 0.5

## 2016-04-30 MED ORDER — IBUPROFEN 600 MG PO TABS: 600.0000 mg | ORAL_TABLET | Freq: Four times a day (QID) | ORAL | 0 refills | Status: DC

## 2016-04-30 MED ORDER — TETANUS-DIPHTH-ACELL PERTUSSIS 5-2.5-18.5 LF-MCG/0.5 IM SUSP: 0.5000 mL | Freq: Once | INTRAMUSCULAR | 0 refills | Status: AC

## 2016-04-30 NOTE — Progress Notes (Signed)
Patient discharge to home via wheelchair with spouse and baby in car seat.  

## 2016-08-14 ENCOUNTER — Encounter: Payer: Self-pay | Admitting: Emergency Medicine

## 2016-08-14 ENCOUNTER — Emergency Department: Payer: Medicaid Other

## 2016-08-14 ENCOUNTER — Emergency Department
Admission: EM | Admit: 2016-08-14 | Discharge: 2016-08-14 | Disposition: A | Discharge status: Home / Self Care | Payer: Medicaid Other | Attending: Emergency Medicine | Admitting: Emergency Medicine

## 2016-08-14 DIAGNOSIS — Y929 Unspecified place or not applicable: Secondary | ICD-10-CM | POA: Diagnosis not present

## 2016-08-14 DIAGNOSIS — E039 Hypothyroidism, unspecified: Secondary | ICD-10-CM | POA: Insufficient documentation

## 2016-08-14 DIAGNOSIS — W1800XA Striking against unspecified object with subsequent fall, initial encounter: Secondary | ICD-10-CM | POA: Insufficient documentation

## 2016-08-14 DIAGNOSIS — S62397A Other fracture of fifth metacarpal bone, left hand, initial encounter for closed fracture: Secondary | ICD-10-CM | POA: Diagnosis not present

## 2016-08-14 DIAGNOSIS — Y999 Unspecified external cause status: Secondary | ICD-10-CM | POA: Diagnosis not present

## 2016-08-14 DIAGNOSIS — S62307A Unspecified fracture of fifth metacarpal bone, left hand, initial encounter for closed fracture: Secondary | ICD-10-CM

## 2016-08-14 DIAGNOSIS — Y939 Activity, unspecified: Secondary | ICD-10-CM | POA: Diagnosis not present

## 2016-08-14 DIAGNOSIS — S6992XA Unspecified injury of left wrist, hand and finger(s), initial encounter: Secondary | ICD-10-CM | POA: Diagnosis present

## 2016-08-14 NOTE — ED Notes (Signed)
Pt c/o L hand pain r/t fall last night. Pt denies head injury or LOC. Sts that she hit L hand when she fell.  Swelling and bruising noted to L hand.  Circulation and motor function intact. Pt sts decr sensation in L 4th and 5th digit.

## 2016-08-14 NOTE — ED Triage Notes (Signed)
Pt fell on left hand last night. Swelling noted.

## 2016-08-14 NOTE — ED Provider Notes (Signed)
[facility] Emergency Department Provider Note  ____________________________________________  Time seen: Approximately 6:03 PM  I have reviewed the triage vital signs and the nursing notes.   HISTORY  Chief Complaint Hand Pain    HPI Ashley Hunter is a 31 y.o. female resents emergency department with left hand pain after falling last night. Patient is not having any difficulty moving fingers. Hand is swollen. She has taken Tylenol for pain. She did not hit head or lose consciousness. She denies shortness of breath, chest pain, nausea, vomiting, abdominal pain.   Past Medical History:  Diagnosis Date  . Hypothyroidism     Patient Active Problem List   Diagnosis Date Noted  . Low amniotic fluid 04/27/2016  . Delivered by cesarean section 04/27/2016  . Status post cesarean section 04/27/2016  . Oligohydramnios, third trimester, fetus 1 04/17/2016  . Decreased fetal movement affecting management of pregnancy in third trimester 04/08/2016    Past Surgical History:  Procedure Laterality Date  . APPENDECTOMY    . CESAREAN SECTION N/A 04/27/2016   Procedure: CESAREAN SECTION FOR BREECH PRESENTATION;  Surgeon: Conard Novak, MD;  Location: [facility] ORS;  Service: Obstetrics;  Laterality: N/A;  Baby girl born @ 1300 Apgars: 8/9 Weight: 5lbs 9ozs    Prior to Admission medications   Medication Sig Start Date End Date Taking? Authorizing Provider  acetaminophen (TYLENOL) 325 MG tablet Take 2 tablets (650 mg total) by mouth every 6 (six) hours. 04/30/16   Tresea Mall, CNM  ibuprofen (ADVIL,MOTRIN) 600 MG tablet Take 1 tablet (600 mg total) by mouth every 6 (six) hours. 04/30/16   Tresea Mall, CNM  levothyroxine (SYNTHROID, LEVOTHROID) 100 MCG tablet Take 100 mcg by mouth daily before breakfast.    Historical Provider, MD  oxyCODONE (OXY IR/ROXICODONE) 5 MG immediate release tablet Take 1 tablet (5 mg total) by mouth every 4 (four) hours as needed for moderate  pain. 04/30/16   Tresea Mall, CNM  Prenatal Vit-Fe Fumarate-FA (PRENATAL MULTIVITAMIN) TABS tablet Take 1 tablet by mouth daily at 12 noon.    Historical Provider, MD    Allergies Patient has no known allergies.  Family History  Problem Relation Age of Onset  . Diabetes Paternal Grandfather     Social History Social History  Substance Use Topics  . Smoking status: Never Smoker  . Smokeless tobacco: Never Used  . Alcohol use No     Review of Systems  Cardiovascular: No chest pain. Respiratory: No SOB. Gastrointestinal: No abdominal pain.  No nausea, no vomiting.  Skin: Negative for rash, abrasions, lacerations, ecchymosis. Neurological: Negative for headaches, numbness or tingling   ____________________________________________   PHYSICAL EXAM:  VITAL SIGNS: ED Triage Vitals  Enc Vitals Group     BP 08/14/16 1624 124/67     Pulse Rate 08/14/16 1624 66     Resp 08/14/16 1624 18     Temp 08/14/16 1624 98.3 F (36.8 C)     Temp Source 08/14/16 1624 Oral     SpO2 08/14/16 1624 100 %     Weight 08/14/16 1625 170 lb (77.1 kg)     Height 08/14/16 1625  (1.549 m)     Head Circumference --      Peak Flow --      Pain Score 08/14/16 1627 2     Pain Loc --      Pain Edu? --      Excl. in GC? --      Constitutional: Alert and  oriented. Well appearing and in no acute distress. Eyes: Conjunctivae are normal. PERRL. EOMI. Head: Atraumatic. ENT:      Ears:      Nose: No congestion/rhinnorhea.      Mouth/Throat: Mucous membranes are moist.  Neck: No stridor.   Cardiovascular: Normal rate, regular rhythm.  Good peripheral circulation. 2+ radial pulses. Respiratory: Normal respiratory effort without tachypnea or retractions. Lungs CTAB. Good air entry to the bases with no decreased or absent breath sounds. Musculoskeletal: Full range of motion to all extremities. No gross deformities appreciated. Moderate swelling over the ulnar side of left hand. No tenderness to  palpation. Neurologic:  Normal speech and language. No gross focal neurologic deficits are appreciated.  Skin:  Skin is warm, dry and intact.    ____________________________________________   LABS (all labs ordered are listed, but only abnormal results are displayed)  Labs Reviewed - No data to display ____________________________________________  EKG   ____________________________________________  RADIOLOGY Lexine Baton, personally viewed and evaluated these images (plain radiographs) as part of my medical decision making, as well as reviewing the written report by the radiologist.  Dg Hand Complete Left  Result Date: 08/14/2016 CLINICAL DATA:  Larey Seat last night with left hand pain EXAM: LEFT HAND - COMPLETE 3+ VIEW COMPARISON:  None. FINDINGS: There is an oblique minimally displaced fracture involving the mid distal left fifth metacarpal. No other acute fracture is seen. Joint spaces appear normal. The radiocarpal joint space is unremarkable. IMPRESSION: Minimally displaced oblique fracture of the mid distal left fifth metacarpal. Electronically Signed   By: Dwyane Dee M.D.   On: 08/14/2016 17:10    ____________________________________________    PROCEDURES  Procedure(s) performed:    Procedures    Medications - No data to display   ____________________________________________   INITIAL IMPRESSION / ASSESSMENT AND PLAN / ED COURSE  Pertinent labs & imaging results that were available during my care of the patient were reviewed by me and considered in my medical decision making (see chart for details).  Review of the [place] CSRS was performed in accordance of the [facility] prior to dispensing any controlled drugs.     Patient's diagnosis is consistent with closed displaced fracture of fifth metacarpal. Vital signs and exam are reassuring. X-ray consistent with fracture. After telling patient that she had a fracture, she responded with "I do not believe you because it  does not hurt." She was placed in ulnar gutter splint. Patient is to follow up with orthopedics as directed. Patient is given ED precautions to return to the ED for any worsening or new symptoms.     ____________________________________________  FINAL CLINICAL IMPRESSION(S) / ED DIAGNOSES  Final diagnoses:  Closed displaced fracture of fifth metacarpal bone of left hand, unspecified portion of metacarpal, initial encounter      NEW MEDICATIONS STARTED DURING THIS VISIT:  Discharge Medication List as of 08/14/2016  6:05 PM          This chart was dictated using voice recognition software/Dragon. Despite best efforts to proofread, errors can occur which can change the meaning. Any change was purely unintentional.    Enid Derry, PA-C 08/14/16 1817    Myrna Blazer, MD 08/14/16 2258

## 2017-11-21 ENCOUNTER — Encounter: Payer: Self-pay | Admitting: Advanced Practice Midwife

## 2017-11-21 ENCOUNTER — Ambulatory Visit (INDEPENDENT_AMBULATORY_CARE_PROVIDER_SITE_OTHER): Payer: Self-pay | Admitting: Advanced Practice Midwife

## 2017-11-21 VITALS — BP 108/60 | Wt 183.0 lb

## 2017-11-21 DIAGNOSIS — O99210 Obesity complicating pregnancy, unspecified trimester: Secondary | ICD-10-CM | POA: Insufficient documentation

## 2017-11-21 DIAGNOSIS — Z3A01 Less than 8 weeks gestation of pregnancy: Secondary | ICD-10-CM

## 2017-11-21 DIAGNOSIS — O99211 Obesity complicating pregnancy, first trimester: Secondary | ICD-10-CM

## 2017-11-21 DIAGNOSIS — Z113 Encounter for screening for infections with a predominantly sexual mode of transmission: Secondary | ICD-10-CM

## 2017-11-21 DIAGNOSIS — O34219 Maternal care for unspecified type scar from previous cesarean delivery: Secondary | ICD-10-CM

## 2017-11-21 DIAGNOSIS — O0990 Supervision of high risk pregnancy, unspecified, unspecified trimester: Secondary | ICD-10-CM

## 2017-11-21 DIAGNOSIS — Z6833 Body mass index (BMI) 33.0-33.9, adult: Secondary | ICD-10-CM

## 2017-11-21 NOTE — Patient Instructions (Addendum)
Exercise During Pregnancy For people of all ages, exercise is an important part of being healthy. Exercise improves heart and lung function and helps to maintain strength, flexibility, and a healthy body weight. Exercise also boosts energy levels and elevates mood. For most women, maintaining an exercise routine throughout pregnancy is recommended. It is only on rare occasions and with certain medical conditions or pregnancy complications that women may be asked to limit or avoid exercise during pregnancy. What are some other benefits to exercising during pregnancy? Along with maintaining strength and flexibility, exercising throughout pregnancy can help to:  Keep strength in muscles that are very important during labor and childbirth.  Decrease low back pain during pregnancy.  Decrease the risk of developing gestational diabetes mellitus (GDM).  Improve blood sugar (glucose) control for women who have GDM.  Decrease the risk of developing preeclampsia. This is a serious condition that causes high blood pressure along with other symptoms, such as swelling and headaches.  Decrease the risk of cesarean delivery.  Speed up the recovery after giving birth.  How often should I exercise? Unless your health care provider gives you different instructions, you should try to exercise on most days or all days of the week. In general, try to exercise with moderate intensity for about 150 minutes per week. This can be spread out across several days, such as exercising for 30 minutes per day on 5 days of each week. You can tell that you are exercising at a moderate intensity if you have a higher heart rate and faster breathing, but you are still able to hold a conversation. What types of moderate-intensity exercise are recommended during pregnancy? There are many types of exercise that are safe for you to do during pregnancy. Unless your health care provider gives you different instructions, do a variety of  exercises that safely increase your heart and breathing (cardiopulmonary) rates and help you to build and maintain muscle strength (strength training). You should always be able to talk in full sentences while exercising during pregnancy. Some examples of exercising that is safe to do during pregnancy include:  Brisk walking or hiking.  Swimming.  Water aerobics.  Riding a stationary bike.  Strength training.  Modified yoga or Pilates. Tell your instructor that you are pregnant. Avoid overstretching and avoid lying on your back for long periods of time.  Running or jogging. Only choose this type of exercise if: ? You ran or jogged regularly before your pregnancy. ? You can run or jog and still talk in complete sentences.  What types of exercise should I not do during pregnancy? Depending on your level of fitness and whether you exercised regularly before your pregnancy, you may be advised to limit vigorous-intensity exercise during your pregnancy. You can tell that you are exercising at a vigorous intensity if you are breathing much harder and faster and cannot hold a conversation while exercising. Some examples of exercising that you should avoid during pregnancy include:  Contact sports.  Activities that place you at risk for falling on or being hit in the belly, such as downhill skiing, water skiing, surfing, rock climbing, cycling, gymnastics, and horseback riding.  Scuba diving.  Sky diving.  Yoga or Pilates in a room that is heated to extreme temperatures ("hot yoga" or "hot Pilates").  Jogging or running, unless you ran or jogged regularly before your pregnancy. While jogging or running, you should always be able to talk in full sentences. Do not run or jog so vigorously   that you are unable to have a conversation.  If you are not used to exercising at elevation (more than 6,000 feet above sea level), do not do so during your pregnancy.  When should I avoid exercising  during pregnancy? Certain medical conditions can make it unsafe to exercise during pregnancy, or they may increase your risk of miscarriage or early labor and birth. Some of these conditions include:  Some types of heart disease.  Some types of lung disease.  Placenta previa. This is when the placenta partially or completely covers the opening of the uterus (cervix).  Frequent bleeding from the vagina during your pregnancy.  Incompetent cervix. This is when your cervix does not remain as tightly closed during pregnancy as it should.  Premature labor.  Ruptured membranes. This is when the protective sac (amniotic sac) opens up and amniotic fluid leaks from your vagina.  Severely low blood count (anemia).  Preeclampsia or pregnancy-caused high blood pressure.  Carrying more than one baby (multiple gestation) and having an additional risk of early labor.  Poorly controlled diabetes.  Being severely underweight or severely overweight.  Intrauterine growth restriction. This is when your baby's growth and development during pregnancy are slower than expected.  Other medical conditions. Ask your health care provider if any apply to you.  What else should I know about exercising during pregnancy? You should take these precautions while exercising during pregnancy:  Avoid overheating. ? Wear loose-fitting, breathable clothes. ? Do not exercise in very high temperatures.  Avoid dehydration. Drink enough water before, during, and after exercise to keep your urine clear or pale yellow.  Avoid overstretching. Because of hormone changes during pregnancy, it is easy to overstretch muscles, tendons, and ligaments during pregnancy.  Start slowly and ask your health care provider to recommend types of exercise that are safe for you, if exercising regularly is new for you.  Pregnancy is not a time for exercising to lose weight. When should I seek medical care? You should stop exercising  and call your health care provider if you have any unusual symptoms, such as:  Mild uterine contractions or abdominal cramping.  Dizziness that does not improve with rest.  When should I seek immediate medical care? You should stop exercising and call your local emergency services (911 in the [place] if you have any unusual symptoms, such as:  Sudden, severe pain in your low back or your belly.  Uterine contractions or abdominal cramping that do not improve with rest.  Chest pain.  Bleeding or fluid leaking from your vagina.  Shortness of breath.  This information is not intended to replace advice given to you by your health care provider. Make sure you discuss any questions you have with your health care provider. Document Released: 04/09/2005 Document Revised: 09/07/2015 Document Reviewed: 06/17/2014 Elsevier Interactive Patient Education  2018 [employer] Eating Plan for Pregnant Women While you are pregnant, your body will require additional nutrition to help support your growing baby. It is recommended that you consume:  150 additional calories each day during your first trimester.  300 additional calories each day during your second trimester.  300 additional calories each day during your third trimester.  Eating a healthy, well-balanced diet is very important for your health and for your baby's health. You also have a higher need for some vitamins and minerals, such as folic acid, calcium, iron, and vitamin D. What do I need to know about eating during pregnancy?  Do not try to lose weight   or go on a diet during pregnancy.  Choose healthy, nutritious foods. Choose  of a sandwich with a glass of milk instead of a candy bar or a high-calorie sugar-sweetened beverage.  Limit your overall intake of foods that have "empty calories." These are foods that have little nutritional value, such as sweets, desserts, candies, sugar-sweetened beverages, and fried foods.  Eat a  variety of foods, especially fruits and vegetables.  Take a prenatal vitamin to help meet the additional needs during pregnancy, specifically for folic acid, iron, calcium, and vitamin D.  Remember to stay active. Ask your health care provider for exercise recommendations that are specific to you.  Practice good food safety and cleanliness, such as washing your hands before you eat and after you prepare raw meat. This helps to prevent foodborne illnesses, such as listeriosis, that can be very dangerous for your baby. Ask your health care provider for more information about listeriosis. What does 150 extra calories look like? Healthy options for an additional 150 calories each day could be any of the following:  Plain low-fat yogurt (6-8 oz) with  cup of berries.  1 apple with 2 teaspoons of peanut butter.  Cut-up vegetables with  cup of hummus.  Low-fat chocolate milk (8 oz or 1 cup).  1 string cheese with 1 medium orange.   of a peanut butter and jelly sandwich on whole-wheat bread (1 tsp of peanut butter).  For 300 calories, you could eat two of those healthy options each day. What is a healthy amount of weight to gain? The recommended amount of weight for you to gain is based on your pre-pregnancy BMI. If your pre-pregnancy BMI was:  Less than 18 (underweight), you should gain 28-40 lb.  18-24.9 (normal), you should gain 25-35 lb.  25-29.9 (overweight), you should gain 15-25 lb.  Greater than 30 (obese), you should gain 11-20 lb.  What if I am having twins or multiples? Generally, pregnant women who will be having twins or multiples may need to increase their daily calories by 300-600 calories each day. The recommended range for total weight gain is 25-54 lb, depending on your pre-pregnancy BMI. Talk with your health care provider for specific guidance about additional nutritional needs, weight gain, and exercise during your pregnancy. What foods can I eat? Grains Any  grains. Try to choose whole grains, such as whole-wheat bread, oatmeal, or brown rice. Vegetables Any vegetables. Try to eat a variety of colors and types of vegetables to get a full range of vitamins and minerals. Remember to wash your vegetables well before eating. Fruits Any fruits. Try to eat a variety of colors and types of fruit to get a full range of vitamins and minerals. Remember to wash your fruits well before eating. Meats and Other Protein Sources Lean meats, including chicken, [place], fish, and lean cuts of beef, veal, or pork. Make sure that all meats are cooked to "well done." Tofu. Tempeh. Beans. Eggs. Peanut butter and other nut butters. Seafood, such as shrimp, crab, and lobster. If you choose fish, select types that are higher in omega-3 fatty acids, including salmon, herring, mussels, trout, sardines, and pollock. Make sure that all meats are cooked to food-safe temperatures. Dairy Pasteurized milk and milk alternatives. Pasteurized yogurt and pasteurized cheese. Cottage cheese. Sour cream. Beverages Water. Juices that contain 100% fruit juice or vegetable juice. Caffeine-free teas and decaffeinated coffee. Drinks that contain caffeine are okay to drink, but it is better to avoid caffeine. Keep your total caffeine  intake to less than 200 mg each day (12 oz of coffee, tea, or soda) or as directed by your health care provider. Condiments Any pasteurized condiments. Sweets and Desserts Any sweets and desserts. Fats and Oils Any fats and oils. The items listed above may not be a complete list of recommended foods or beverages. Contact your dietitian for more options. What foods are not recommended? Vegetables Unpasteurized (raw) vegetable juices. Fruits Unpasteurized (raw) fruit juices. Meats and Other Protein Sources Cured meats that have nitrates, such as bacon, salami, and hotdogs. Luncheon meats, bologna, or other deli meats (unless they are reheated until they are  steaming hot). Refrigerated pate, meat spreads from a meat counter, smoked seafood that is found in the refrigerated section of a store. Raw fish, such as sushi or sashimi. High mercury content fish, such as tilefish, shark, swordfish, and king mackerel. Raw meats, such as tuna or beef tartare. Undercooked meats and poultry. Make sure that all meats are cooked to food-safe temperatures. Dairy Unpasteurized (raw) milk and any foods that have raw milk in them. Soft cheeses, such as feta, queso blanco, queso fresco, Brie, Camembert cheeses, blue-veined cheeses, and Panela cheese (unless it is made with pasteurized milk, which must be stated on the label). Beverages Alcohol. Sugar-sweetened beverages, such as sodas, teas, or energy drinks. Condiments Homemade fermented foods and drinks, such as pickles, sauerkraut, or kombucha drinks. (Store-bought pasteurized versions of these are okay.) Other Salads that are made in the store, such as ham salad, chicken salad, egg salad, tuna salad, and seafood salad. The items listed above may not be a complete list of foods and beverages to avoid. Contact your dietitian for more information. This information is not intended to replace advice given to you by your health care provider. Make sure you discuss any questions you have with your health care provider. Document Released: 01/22/2014 Document Revised: 09/15/2015 Document Reviewed: 09/22/2013 Elsevier Interactive Patient Education  2018 [employer] Prenatal Care WHAT IS PRENATAL CARE? Prenatal care is the process of caring for a pregnant woman before she gives birth. Prenatal care makes sure that she and her baby remain as healthy as possible throughout pregnancy. Prenatal care may be provided by a midwife, family practice health care provider, or a childbirth and pregnancy specialist (obstetrician). Prenatal care may include physical examinations, testing, treatments, and education on nutrition, lifestyle, and  social support services. WHY IS PRENATAL CARE SO IMPORTANT? Early and consistent prenatal care increases the chance that you and your baby will remain healthy throughout your pregnancy. This type of care also decreases a baby's risk of being born too early (prematurely), or being born smaller than expected (small for gestational age). Any underlying medical conditions you may have that could pose a risk during your pregnancy are discussed during prenatal care visits. You will also be monitored regularly for any new conditions that may arise during your pregnancy so they can be treated quickly and effectively. WHAT HAPPENS DURING PRENATAL CARE VISITS? Prenatal care visits may include the following: Discussion Tell your health care provider about any new signs or symptoms you have experienced since your last visit. These might include:  Nausea or vomiting.  Increased or decreased level of energy.  Difficulty sleeping.  Back or leg pain.  Weight changes.  Frequent urination.  Shortness of breath with physical activity.  Changes in your skin, such as the development of a rash or itchiness.  Vaginal discharge or bleeding.  Feelings of excitement or nervousness.  Changes in   your baby's movements.  You may want to write down any questions or topics you want to discuss with your health care provider and bring them with you to your appointment. Examination During your first prenatal care visit, you will likely have a complete physical exam. Your health care provider will often examine your vagina, cervix, and the position of your uterus, as well as check your heart, lungs, and other body systems. As your pregnancy progresses, your health care provider will measure the size of your uterus and your baby's position inside your uterus. He or she may also examine you for early signs of labor. Your prenatal visits may also include checking your blood pressure and, after about 10-12 weeks of  pregnancy, listening to your baby's heartbeat. Testing Regular testing often includes:  Urinalysis. This checks your urine for glucose, protein, or signs of infection.  Blood count. This checks the levels of white and red blood cells in your body.  Tests for sexually transmitted infections (STIs). Testing for STIs at the beginning of pregnancy is routinely done and is required in many states.  Antibody testing. You will be checked to see if you are immune to certain illnesses, such as rubella, that can affect a developing fetus.  Glucose screen. Around 24-28 weeks of pregnancy, your blood glucose level will be checked for signs of gestational diabetes. Follow-up tests may be recommended.  Group B strep. This is a bacteria that is commonly found inside a woman's vagina. This test will inform your health care provider if you need an antibiotic to reduce the amount of this bacteria in your body prior to labor and childbirth.  Ultrasound. Many pregnant women undergo an ultrasound screening around 18-20 weeks of pregnancy to evaluate the health of the fetus and check for any developmental abnormalities.  HIV (human immunodeficiency virus) testing. Early in your pregnancy, you will be screened for HIV. If you are at high risk for HIV, this test may be repeated during your third trimester of pregnancy.  You may be offered other testing based on your age, personal or family medical history, or other factors. HOW OFTEN SHOULD I PLAN TO SEE MY HEALTH CARE PROVIDER FOR PRENATAL CARE? Your prenatal care check-up schedule depends on any medical conditions you have before, or develop during, your pregnancy. If you do not have any underlying medical conditions, you will likely be seen for checkups:  Monthly, during the first 6 months of pregnancy.  Twice a month during months 7 and 8 of pregnancy.  Weekly starting in the 9th month of pregnancy and until delivery.  If you develop signs of early labor  or other concerning signs or symptoms, you may need to see your health care provider more often. Ask your health care provider what prenatal care schedule is best for you. WHAT CAN I DO TO KEEP MYSELF AND MY BABY AS HEALTHY AS POSSIBLE DURING MY PREGNANCY?  Take a prenatal vitamin containing 400 micrograms (0.4 mg) of folic acid every day. Your health care provider may also ask you to take additional vitamins such as iodine, vitamin D, iron, copper, and zinc.  Take 1500-2000 mg of calcium daily starting at your 20th week of pregnancy until you deliver your baby.  Make sure you are up to date on your vaccinations. Unless directed otherwise by your health care provider: ? You should receive a tetanus, diphtheria, and pertussis (Tdap) vaccination between the 27th and 36th week of your pregnancy, regardless of when your last Tdap immunization   occurred. This helps protect your baby from whooping cough (pertussis) after he or she is born. ? You should receive an annual inactivated influenza vaccine (IIV) to help protect you and your baby from influenza. This can be done at any point during your pregnancy.  Eat a well-rounded diet that includes: ? Fresh fruits and vegetables. ? Lean proteins. ? Calcium-rich foods such as milk, yogurt, hard cheeses, and dark, leafy greens. ? Whole grain breads.  Do noteat seafood high in mercury, including: ? Swordfish. ? Tilefish. ? Shark. ? King mackerel. ? More than 6 oz tuna per week.  Do not eat: ? Raw or undercooked meats or eggs. ? Unpasteurized foods, such as soft cheeses (brie, blue, or feta), juices, and milks. ? Lunch meats. ? Hot dogs that have not been heated until they are steaming.  Drink enough water to keep your urine clear or pale yellow. For many women, this may be 10 or more 8 oz glasses of water each day. Keeping yourself hydrated helps deliver nutrients to your baby and may prevent the start of pre-term uterine contractions.  Do not use  any tobacco products including cigarettes, chewing tobacco, or electronic cigarettes. If you need help quitting, ask your health care provider.  Do not drink beverages containing alcohol. No safe level of alcohol consumption during pregnancy has been determined.  Do not use any illegal drugs. These can harm your developing baby or cause a miscarriage.  Ask your health care provider or pharmacist before taking any prescription or over-the-counter medicines, herbs, or supplements.  Limit your caffeine intake to no more than 200 mg per day.  Exercise. Unless told otherwise by your health care provider, try to get 30 minutes of moderate exercise most days of the week. Do not  do high-impact activities, contact sports, or activities with a high risk of falling, such as horseback riding or downhill skiing.  Get plenty of rest.  Avoid anything that raises your body temperature, such as hot tubs and saunas.  If you own a cat, do not empty its litter box. Bacteria contained in cat feces can cause an infection called toxoplasmosis. This can result in serious harm to the fetus.  Stay away from chemicals such as insecticides, lead, mercury, and cleaning or paint products that contain solvents.  Do not have any X-rays taken unless medically necessary.  Take a childbirth and breastfeeding preparation class. Ask your health care provider if you need a referral or recommendation.  This information is not intended to replace advice given to you by your health care provider. Make sure you discuss any questions you have with your health care provider. Document Released: 04/12/2003 Document Revised: 09/12/2015 Document Reviewed: 06/24/2013 Elsevier Interactive Patient Education  2017 [employer]  Pregnancy and Zika Virus Disease Zika virus disease, or Zika, is an illness that can spread to people from mosquitoes that carry the virus. It may also spread from person to person through infected body fluids.  Zika first occurred in [place], but recently it has spread to new areas. The virus occurs in tropical climates. The location of Zika continues to change. Most people who become infected with Zika virus do not develop serious illness. However, Zika may cause birth defects in an unborn baby whose mother is infected with the virus. It may also increase the risk of miscarriage. What are the symptoms of Zika virus disease? In many cases, people who have been infected with Zika virus do not develop any symptoms. If symptoms appear,  they usually start about a week after the person is infected. Symptoms are usually mild. They may include:  Fever.  Rash.  Red eyes.  Joint pain.  How does Zika virus disease spread? The main way that Zika virus spreads is through the bite of a certain type of mosquito. Unlike most types of mosquitos, which bite only at night, the type of mosquito that carries Zika virus bites both at night and during the day. Zika virus can also spread through sexual contact, through a blood transfusion, and from a mother to her baby before or during birth. Once you have had Zika virus disease, it is unlikely that you will get it again. Can I pass Zika to my baby during pregnancy? Yes, Zika can pass from a mother to her baby before or during birth. What problems can Zika cause for my baby? A woman who is infected with Zika virus while pregnant is at risk of having her baby born with a condition in which the brain or head is smaller than expected (microcephaly). Babies who have microcephaly can have developmental delays, seizures, hearing problems, and vision problems. Having Zika virus disease during pregnancy can also increase the risk of miscarriage. How can Zika virus disease be prevented? There is no vaccine to prevent Zika. The best way to prevent the disease is to avoid infected mosquitoes and avoid exposure to body fluids that can spread the virus. Avoid any possible exposure to  Zika by taking the following precautions. For women and their sex partners:  Avoid traveling to high-risk areas. The locations where [place] is being reported change often. To identify high-risk areas, check the CDC travel website: http://davidson-gomez.com/www.cdc.gov/zika/geo/index.html  If you or your sex partner must travel to a high-risk area, talk with a health care provider before and after traveling.  Take all precautions to avoid mosquito bites if you live in, or travel to, any of the high-risk areas. Insect repellents are safe to use during pregnancy.  Ask your health care provider when it is safe to have sexual contact.  For women:  If you are pregnant or trying to become pregnant, avoid sexual contact with persons who may have been exposed to [place] virus, persons who have possible symptoms of Zika, or persons whose history you are unsure about. If you choose to have sexual contact with someone who may have been exposed to [place] virus, use condoms correctly during the entire duration of sexual activity, every time. Do not share sexual devices, as you may be exposed to body fluids.  Ask your health care provider about when it is safe to attempt pregnancy after a possible exposure to Zika virus.  What steps should I take to avoid mosquito bites? Take these steps to avoid mosquito bites when you are in a high-risk area:  Wear loose clothing that covers your arms and legs.  Limit your outdoor activities.  Do not open windows unless they have window screens.  Sleep under mosquito nets.  Use insect repellent. The best insect repellents have:  DEET, picaridin, oil of lemon eucalyptus (OLE), or IR3535 in them.  Higher amounts of an active ingredient in them.  Remember that insect repellents are safe to use during pregnancy.  Do not use OLE on children who are younger than 653 years of age. Do not use insect repellent on babies who are younger than 452 months of age.  Cover your child's stroller with mosquito  netting. Make sure the netting fits snugly and that any loose  netting does not cover your child's mouth or nose. Do not use a blanket as a mosquito-protection cover.  Do not apply insect repellent underneath clothing.  If you are using sunscreen, apply the sunscreen before applying the insect repellent.  Treat clothing with permethrin. Do not apply permethrin directly to your skin. Follow label directions for safe use.  Get rid of standing water, where mosquitoes may reproduce. Standing water is often found in items such as buckets, bowls, animal food dishes, and flowerpots.  When you return from traveling to any high-risk area, continue taking actions to protect yourself against mosquito bites for 3 weeks, even if you show no signs of illness. This will prevent spreading Zika virus to uninfected mosquitoes. What should I know about the sexual transmission of Zika? People can spread Zika to their sexual partners during vaginal, anal, or oral sex, or by sharing sexual devices. Many people with [place] do not develop symptoms, so a person could spread the disease without knowing that they are infected. The greatest risk is to women who are pregnant or who may become pregnant. Zika virus can live longer in semen than it can live in blood. Couples can prevent sexual transmission of the virus by:  Using condoms correctly during the entire duration of sexual activity, every time. This includes vaginal, anal, and oral sex.  Not sharing sexual devices. Sharing increases your risk of being exposed to body fluid from another person.  Avoiding all sexual activity until your health care provider says it is safe.  Should I be tested for Zika virus? A sample of your blood can be tested for Zika virus. A pregnant woman should be tested if she may have been exposed to the virus or if she has symptoms of Zika. She may also have additional tests done during her pregnancy, such ultrasound testing. Talk with your  health care provider about which tests are recommended. This information is not intended to replace advice given to you by your health care provider. Make sure you discuss any questions you have with your health care provider. Document Released: 12/29/2014 Document Revised: 09/15/2015 Document Reviewed: 12/22/2014 Elsevier Interactive Patient Education  [employer].

## 2017-11-21 NOTE — Progress Notes (Signed)
New Obstetric Patient H&P    Chief Complaint: "Desires prenatal care"   History of Present Illness: Patient is a 32 y.o. G2P1001 Not Hispanic or Latino female, presents with amenorrhea and positive home pregnancy test. Patient's last menstrual period was 10/05/2017 (exact date). and based on her  LMP, her EDD is Estimated Date of Delivery: 07/12/18 and her EGA is 5414w5d. Cycles are 4. days, regular, and occur approximately every : 28 days. Her last pap smear was unknown. Patient refused today- no record in [facility] or [place].    She had a urine pregnancy test which was positive 1 week(s)  ago. Her last menstrual period was normal and lasted for  4 day(s). Since her LMP she claims she has experienced fatigue and muscle weakness. She denies vaginal bleeding. Her past medical history is contributory for hypothyroid. Her prior pregnancies are notable for cesarean section for breech/oligohydramnios  Since her LMP, she admits to the use of tobacco products  no She claims she has gained   4 pounds since the start of her pregnancy.  There are cats in the home in the home  no  She admits close contact with children on a regular basis  yes  She has had chicken pox in the past no She has had Tuberculosis exposures, symptoms, or previously tested positive for TB   no Current or past history of domestic violence. no  Genetic Screening/Teratology Counseling: (Includes patient, baby's father, or anyone in either family with:)   1. [facility]'s [facility]/= 5135 [facility]  no 2. Thalassemia ([place], [place], Mediterranean, or Asian background): MCV<80  no 3. Neural tube defect (meningomyelocele, spina bifida, anencephaly)  no 4. Congenital heart defect  no  5. Down syndrome  no 6. Tay-Sachs (Jewish, [place])  no 7. Canavan's Disease  no 8. Sickle cell disease or trait (African)  no  9. Hemophilia or other blood disorders  no  10. Muscular dystrophy  no  11. Cystic fibrosis  no  12. Huntington's Chorea  no    13. Mental retardation/autism  no 14. Other inherited genetic or chromosomal disorder  no 15. Maternal metabolic disorder (DM, PKU, etc)  no 16. Patient or FOB with a child with a birth defect not listed above no  16a. Patient or FOB with a birth defect themselves no 17. Recurrent pregnancy loss, or stillbirth  no  18. Any medications since LMP other than prenatal vitamins (include vitamins, supplements, OTC meds, drugs, alcohol)  Synthroid 19. Any other genetic/environmental exposure to discuss  no  Infection History:   1. Lives with someone with TB or TB exposed  no  2. Patient or partner has history of genital herpes  no 3. Rash or viral illness since LMP  no 4. History of STI (GC, CT, HPV, syphilis, HIV)  no 5. History of recent travel :  Patient is leaving in 2 days for a 10 day cruise to [place]. Reviewed Zika precautions  Other pertinent information:  no     Review of Systems:10 point review of systems negative unless otherwise noted in HPI  Past Medical History:  Past Medical History:  Diagnosis Date  . Hypothyroidism     Past Surgical History:  Past Surgical History:  Procedure Laterality Date  . APPENDECTOMY    . CESAREAN SECTION N/A 04/27/2016   Procedure: CESAREAN SECTION FOR BREECH PRESENTATION;  Surgeon: Conard NovakStephen D Jackson, MD;  Location: [facility] ORS;  Service: Obstetrics;  Laterality: N/A;  Baby girl born @ 1300 Apgars:  8/9 Weight: 5lbs 9ozs    Gynecologic History: Patient's last menstrual period was 10/05/2017 (exact date).  Obstetric History: G2P1001  Family History:  Family History  Problem Relation Age of Onset  . Diabetes Paternal Grandfather     Social History:  Social History   Socioeconomic History  . Marital status: Married    Spouse name: Not on file  . Number of children: Not on file  . Years of education: Not on file  . Highest education level: Not on file  Occupational History  . Not on file  Social Needs  . Financial resource  strain: Not on file  . Food insecurity:    Worry: Not on file    Inability: Not on file  . Transportation needs:    Medical: Not on file    Non-medical: Not on file  Tobacco Use  . Smoking status: Never Smoker  . Smokeless tobacco: Never Used  Substance and Sexual Activity  . Alcohol use: No  . Drug use: No  . Sexual activity: Yes  Lifestyle  . Physical activity:    Days per week: Not on file    Minutes per session: Not on file  . Stress: Not on file  Relationships  . Social connections:    Talks on phone: Not on file    Gets together: Not on file    Attends religious service: Not on file    Active member of club or organization: Not on file    Attends meetings of clubs or organizations: Not on file    Relationship status: Not on file  . Intimate partner violence:    Fear of current or ex partner: Not on file    Emotionally abused: Not on file    Physically abused: Not on file    Forced sexual activity: Not on file  Other Topics Concern  . Not on file  Social History Narrative  . Not on file    Allergies:  No Known Allergies  Medications: Prior to Admission medications   Medication Sig Start Date End Date Taking? Authorizing Provider  levothyroxine (SYNTHROID, LEVOTHROID) 150 MCG tablet Take 150 mcg by mouth every morning. 09/19/17  Yes [provider]  Prenatal Vit-Fe Fumarate-FA (PRENATAL MULTIVITAMIN) TABS tablet Take 1 tablet by mouth daily at 12 noon.   Yes [provider]    Physical Exam Vitals: Blood pressure 108/60, weight 183 lb (83 kg), last menstrual period 10/05/2017  General: NAD HEENT: normocephalic, anicteric Thyroid: no enlargement, no palpable nodules Pulmonary: No increased work of breathing, CTAB Cardiovascular: RRR, distal pulses 2+ Abdomen: NABS, soft, non-tender, non-distended.  Umbilicus without lesions.  No hepatomegaly, splenomegaly or masses palpable. No evidence of hernia  Genitourinary: deferred for patient  refused Extremities: no edema, erythema, or tenderness Neurologic: Grossly intact Psychiatric: mood appropriate, affect full   Assessment: 32 y.o. G2P1001 at [redacted]w[redacted]d presenting to initiate prenatal care  Plan: 1) Avoid alcoholic beverages. 2) Patient encouraged not to smoke.  3) Discontinue the use of all non-medicinal drugs and chemicals.  4) Take prenatal vitamins daily.  5) Nutrition, food safety (fish, cheese advisories, and high nitrite foods) and exercise discussed. 6) Hospital and practice style discussed with cross coverage system.  7) Genetic Screening, such as with 1st Trimester Screening, cell free fetal DNA, AFP testing, and Ultrasound, as well as with amniocentesis and CVS as appropriate, is discussed with patient. At the conclusion of today's visit patient declined genetic testing 8) Patient is asked about travel to areas  at risk for the Zika virus, and counseled to avoid travel and exposure to mosquitoes or sexual partners who may have themselves been exposed to the virus. Testing is discussed, and will be ordered as appropriate.  9) Zika precautions given for [place] cruise 10) Labwork when [place] is active 11) Dating in 2 weeks when returns from cruise   Tresea Mall, CNM [facility], [facility] 11/21/2017, 3:11 PM

## 2017-12-06 ENCOUNTER — Encounter: Payer: Self-pay | Admitting: Obstetrics and Gynecology

## 2017-12-06 ENCOUNTER — Ambulatory Visit (INDEPENDENT_AMBULATORY_CARE_PROVIDER_SITE_OTHER): Payer: Self-pay

## 2017-12-06 ENCOUNTER — Other Ambulatory Visit: Payer: Self-pay | Admitting: Advanced Practice Midwife

## 2017-12-06 ENCOUNTER — Ambulatory Visit (INDEPENDENT_AMBULATORY_CARE_PROVIDER_SITE_OTHER): Payer: Self-pay | Admitting: Obstetrics and Gynecology

## 2017-12-06 VITALS — BP 106/62 | Wt 183.0 lb

## 2017-12-06 DIAGNOSIS — O0990 Supervision of high risk pregnancy, unspecified, unspecified trimester: Secondary | ICD-10-CM

## 2017-12-06 DIAGNOSIS — Z3A08 8 weeks gestation of pregnancy: Secondary | ICD-10-CM

## 2017-12-06 DIAGNOSIS — O3411 Maternal care for benign tumor of corpus uteri, first trimester: Secondary | ICD-10-CM

## 2017-12-06 DIAGNOSIS — O34219 Maternal care for unspecified type scar from previous cesarean delivery: Secondary | ICD-10-CM

## 2017-12-06 DIAGNOSIS — O99211 Obesity complicating pregnancy, first trimester: Secondary | ICD-10-CM

## 2017-12-06 DIAGNOSIS — N8311 Corpus luteum cyst of right ovary: Secondary | ICD-10-CM

## 2017-12-06 DIAGNOSIS — Z6833 Body mass index (BMI) 33.0-33.9, adult: Secondary | ICD-10-CM

## 2017-12-06 LAB — POCT URINALYSIS DIPSTICK OB
GLUCOSE, UA: NEGATIVE — AB
POC,PROTEIN,UA: NEGATIVE

## 2017-12-06 NOTE — Progress Notes (Signed)
Routine Prenatal Care Visit  Subjective  Ashley LukesHarshana J Hunter is a 32 y.o. G2P1001 at 10330w6d being seen today for ongoing prenatal care.  She is currently monitored for the following issues for this high-risk pregnancy and has History of cesarean delivery, currently pregnant; Supervision of high risk pregnancy, antepartum; Obesity affecting pregnancy; and BMI 33.0-33.9,adult on their problem list.  ----------------------------------------------------------------------------------- Patient reports no complaints.    . Vag. Bleeding: None.  Movement: Absent. Denies leaking of fluid.  U/S confirms single, living IUP with +cardiac activity. Small subchorionic hemorrhage.  She denies vaginal bleeding. She does not some bilateral leg cramping.  No edema.  ----------------------------------------------------------------------------------- The following portions of the patient's history were reviewed and updated as appropriate: allergies, current medications, past family history, past medical history, past social history, past surgical history and problem list. Problem list updated.   Objective  Blood pressure 106/62, weight 183 lb (83 kg), last menstrual period 10/05/2017, unknown if currently breastfeeding. Pregravid weight 179 lb (81.2 kg) Total Weight Gain 4 lb (1.814 kg) Urinalysis:      Fetal Status: Fetal Heart Rate (bpm): Present   Movement: Absent     General:  Alert, oriented and cooperative. Patient is in no acute distress.  Skin: Skin is warm and dry. No rash noted.   Cardiovascular: Normal heart rate noted  Respiratory: Normal respiratory effort, no problems with respiration noted  Abdomen: Soft, gravid, appropriate for gestational age.       Pelvic:  Cervical exam deferred        Extremities: Normal range of motion.     Mental Status: Normal mood and affect. Normal behavior. Normal judgment and thought content.   Assessment   32 y.o. G2P1001 at 2530w6d by  07/12/2018, by Last Menstrual  Period presenting for routine prenatal visit  Plan   pregnancy 2 Problems (from 10/05/17 to present)    Problem Noted Resolved   BMI 33.0-33.9,adult 12/06/2017 by Conard NovakJackson, Mariaelena Cade D, MD No   History of cesarean delivery, currently pregnant 11/21/2017 by Tresea MallGledhill, Jane, CNM No   Supervision of high risk pregnancy, antepartum 11/21/2017 by Tresea MallGledhill, Jane, CNM No   Overview Signed 11/21/2017  3:09 PM by Tresea MallGledhill, Jane, CNM    Clinic [facility]  Dating  Blood type:     Genetic Screen 1 Screen:    AFP:     Quad:     NIPS: Antibody:   Anatomic US  Rubella:   Varicella: @VZVIGG @  GTT Early:               Third trimester:  RPR:     Rhogam  HBsAg:     TDaP vaccine                       Flu Shot: HIV:     Baby Food                                GBS:   Contraception  Pap:  CBB     CS/VBAC Primary breech/oligo 2017   Support Person Husband Hemal           Obesity affecting pregnancy 11/21/2017 by Tresea MallGledhill, Jane, CNM No       Preterm labor symptoms and general obstetric precautions including but not limited to vaginal bleeding, contractions, leaking of fluid and fetal movement were reviewed in detail with the patient. Please refer to After Visit Summary for other  counseling recommendations.   - again defers pelvic exam - expect to have medicaid active by early September and labs will be drawn at that time, including thyroid hormone. - bleeding and SAB precautions given.  Return in about 4 weeks (around 01/03/2018) for Routine Prenatal Appointment.  Thomasene MohairStephen Alpha Chouinard, MD, Merlinda FrederickFACOG [facility], [facility] 12/06/2017 3:29 PM

## 2017-12-06 NOTE — Patient Instructions (Signed)
For muscle cramps: 1) drink plenty of water 2) eat food rich in potassium 3) try taking Magnesium oxide 400 mg tablet (can buy at drug store). Take 1 tablet with food once daily.

## 2017-12-26 ENCOUNTER — Other Ambulatory Visit: Payer: Self-pay

## 2017-12-26 ENCOUNTER — Encounter: Payer: Self-pay | Admitting: Emergency Medicine

## 2017-12-26 ENCOUNTER — Emergency Department
Admission: EM | Admit: 2017-12-26 | Discharge: 2017-12-26 | Disposition: A | Discharge status: Home / Self Care | Payer: Medicaid Other | Attending: Emergency Medicine | Admitting: Emergency Medicine

## 2017-12-26 DIAGNOSIS — Z79899 Other long term (current) drug therapy: Secondary | ICD-10-CM | POA: Diagnosis not present

## 2017-12-26 DIAGNOSIS — O9989 Other specified diseases and conditions complicating pregnancy, childbirth and the puerperium: Secondary | ICD-10-CM | POA: Insufficient documentation

## 2017-12-26 DIAGNOSIS — E039 Hypothyroidism, unspecified: Secondary | ICD-10-CM | POA: Insufficient documentation

## 2017-12-26 DIAGNOSIS — K2970 Gastritis, unspecified, without bleeding: Secondary | ICD-10-CM | POA: Diagnosis not present

## 2017-12-26 LAB — COMPREHENSIVE METABOLIC PANEL
ALT: 17 U/L (ref 0–44)
ANION GAP: 7 (ref 5–15)
AST: 18 U/L (ref 15–41)
Albumin: 4 g/dL (ref 3.5–5.0)
Alkaline Phosphatase: 52 U/L (ref 38–126)
BUN: 6 mg/dL (ref 6–20)
CALCIUM: 9.1 mg/dL (ref 8.9–10.3)
CHLORIDE: 107 mmol/L (ref 98–111)
CO2: 23 mmol/L (ref 22–32)
CREATININE: 0.45 mg/dL (ref 0.44–1.00)
Glucose, Bld: 82 mg/dL (ref 70–99)
Potassium: 3.6 mmol/L (ref 3.5–5.1)
SODIUM: 137 mmol/L (ref 135–145)
Total Bilirubin: 0.5 mg/dL (ref 0.3–1.2)
Total Protein: 7.2 g/dL (ref 6.5–8.1)

## 2017-12-26 LAB — CBC
HCT: 36.8 % (ref 35.0–47.0)
HEMOGLOBIN: 12.7 g/dL (ref 12.0–16.0)
MCH: 29.2 pg (ref 26.0–34.0)
MCHC: 34.6 g/dL (ref 32.0–36.0)
MCV: 84.4 fL (ref 80.0–100.0)
PLATELETS: 251 10*3/uL (ref 150–440)
RBC: 4.36 MIL/uL (ref 3.80–5.20)
RDW: 13.7 % (ref 11.5–14.5)
WBC: 9.1 10*3/uL (ref 3.6–11.0)

## 2017-12-26 LAB — URINALYSIS, COMPLETE (UACMP) WITH MICROSCOPIC
Bilirubin Urine: NEGATIVE
Glucose, UA: NEGATIVE mg/dL
Ketones, ur: NEGATIVE mg/dL
Leukocytes, UA: NEGATIVE
Nitrite: NEGATIVE
PH: 6 (ref 5.0–8.0)
PROTEIN: NEGATIVE mg/dL
Specific Gravity, Urine: 1.006 (ref 1.005–1.030)

## 2017-12-26 LAB — HCG, QUANTITATIVE, PREGNANCY: hCG, Beta Chain, Quant, S: 21760 m[IU]/mL — ABNORMAL HIGH (ref <5)

## 2017-12-26 LAB — LIPASE, BLOOD: LIPASE: 23 U/L (ref 11–51)

## 2017-12-26 MED ORDER — PANTOPRAZOLE SODIUM 20 MG PO TBEC: 20.0000 mg | DELAYED_RELEASE_TABLET | Freq: Every day | ORAL | 1 refills | Status: DC

## 2017-12-26 MED ORDER — LIDOCAINE VISCOUS HCL 2 % MT SOLN
15.0000 mL | Freq: Once | OROMUCOSAL | Status: AC
  Administered 2017-12-26: 15 mL via OROMUCOSAL
  Filled 2017-12-26: qty 15

## 2017-12-26 NOTE — ED Triage Notes (Signed)
Pt in via POV with complaints mid abdominal pain x one day, denies N/V/D.  Pt is [redacted] weeks pregnant, denies any bleeding.  Vitals WDL.  NAD noted at this time.

## 2017-12-26 NOTE — Discharge Instructions (Addendum)
Please seek medical attention for any high fevers, chest pain, shortness of breath, change in behavior, persistent vomiting, bloody stool or any other new or concerning symptoms.  

## 2017-12-26 NOTE — ED Provider Notes (Signed)
[facility] Emergency Department Provider Note   ____________________________________________   I have reviewed the triage vital signs and the nursing notes.   HISTORY  Chief Complaint Abdominal Pain   History limited by: Not Limited   HPI Ashley Hunter is a 32 y.o. female who presents to the emergency department today because of concern for abdominal pain in the setting of pregnancy. The patient states the pain started last night. She states it did start after eating. Located in the upper abdomen. It has been constant since it started. She has not had any associated nausea or vomiting. Denies any change in defecation. States she has had similar symptoms one time in the past. Has not had any vaginal bleeding.    Per medical record review patient has a history of appendectomy  Past Medical History:  Diagnosis Date  . Hypothyroidism     Patient Active Problem List   Diagnosis Date Noted  . BMI 33.0-33.9,adult 12/06/2017  . History of cesarean delivery, currently pregnant 11/21/2017  . Supervision of high risk pregnancy, antepartum 11/21/2017  . Obesity affecting pregnancy 11/21/2017    Past Surgical History:  Procedure Laterality Date  . APPENDECTOMY    . CESAREAN SECTION N/A 04/27/2016   Procedure: CESAREAN SECTION FOR BREECH PRESENTATION;  Surgeon: Conard Novak, MD;  Location: [facility] ORS;  Service: Obstetrics;  Laterality: N/A;  Baby girl born @ 1300 Apgars: 8/9 Weight: 5lbs 9ozs    Prior to Admission medications   Medication Sig Start Date End Date Taking? Authorizing Provider  levothyroxine (SYNTHROID, LEVOTHROID) 150 MCG tablet Take 150 mcg by mouth every morning. 09/19/17   [provider]  Prenatal Vit-Fe Fumarate-FA (PRENATAL MULTIVITAMIN) TABS tablet Take 1 tablet by mouth daily at 12 noon.    [provider]    Allergies Patient has no known allergies.  Family History  Problem Relation Age of Onset  . Diabetes  Paternal Grandfather     Social History Social History   Tobacco Use  . Smoking status: Never Smoker  . Smokeless tobacco: Never Used  Substance Use Topics  . Alcohol use: No  . Drug use: No    Review of Systems Constitutional: No fever/chills Eyes: No visual changes. ENT: No sore throat. Cardiovascular: Denies chest pain. Respiratory: Denies shortness of breath. Gastrointestinal: Positive for upper abdomen pain.  Genitourinary: Negative for dysuria. Musculoskeletal: Negative for back pain. Skin: Negative for rash. Neurological: Negative for headaches, focal weakness or numbness.  ____________________________________________   PHYSICAL EXAM:  VITAL SIGNS: ED Triage Vitals  Enc Vitals Group     BP 12/26/17 1444 110/65     Pulse Rate 12/26/17 1444 65     Resp 12/26/17 1444 16     Temp 12/26/17 1444 97.8 F (36.6 C)     Temp src --      SpO2 12/26/17 1444 100 %     Weight 12/26/17 1445 182 lb (82.6 kg)     Height 12/26/17 1445 5\' 1"  (1.549 m)     Head Circumference --      Peak Flow --      Pain Score 12/26/17 1445 3   Constitutional: Alert and oriented.  Eyes: Conjunctivae are normal.  ENT      Head: Normocephalic and atraumatic.      Nose: No congestion/rhinnorhea.      Mouth/Throat: Mucous membranes are moist.      Neck: No stridor. Hematological/Lymphatic/Immunilogical: No cervical lymphadenopathy. Cardiovascular: Normal rate, regular rhythm.  No murmurs,  rubs, or gallops.  Respiratory: Normal respiratory effort without tachypnea nor retractions. Breath sounds are clear and equal bilaterally. No wheezes/rales/rhonchi. Gastrointestinal: Soft and non tender. No rebound. No guarding.  Genitourinary: Deferred Musculoskeletal: Normal range of motion in all extremities. No lower extremity edema. Neurologic:  Normal speech and language. No gross focal neurologic deficits are appreciated.  Skin:  Skin is warm, dry and intact. No rash noted. Psychiatric: Mood and  affect are normal. Speech and behavior are normal. Patient exhibits appropriate insight and judgment.  ____________________________________________    LABS (pertinent positives/negatives)  CBC wnl CMP wnl Lipase 23 ____________________________________________   EKG  I, Phineas Semen, attending physician, personally viewed and interpreted this EKG  EKG Time: 1504 Rate: 74 Rhythm: normal sinus rhythm Axis: no st elevation Intervals: qtc 444 QRS: narrow, q waves v1 ST changes: no st elevation Impression: abnormal ekg   ____________________________________________    RADIOLOGY  None  ____________________________________________   PROCEDURES  Procedures  ____________________________________________   INITIAL IMPRESSION / ASSESSMENT AND PLAN / ED COURSE  Pertinent labs & imaging results that were available during my care of the patient were reviewed by me and considered in my medical decision making (see chart for details).     Patient presented to the emergency department today because of concerns for upper abdominal pain in the setting of early pregnancy.  Patient status post appendectomy.  At this point I think gastritis likely.  Patient did have good relief of pain after viscous lidocaine.  I doubt gallbladder disease.  Doubt other significant intra-abdominal infection.  Will give patient prescription for antiacid as well as information to food to avoid.  Discussed this with patient.   ____________________________________________   FINAL CLINICAL IMPRESSION(S) / ED DIAGNOSES  Final diagnoses:  Gastritis without bleeding, unspecified chronicity, unspecified gastritis type     Note: This dictation was prepared with Dragon dictation. Any transcriptional errors that result from this process are unintentional     Phineas Semen, MD 12/26/17 1704

## 2017-12-26 NOTE — ED Triage Notes (Signed)
FIRST NURSE NOTE-pregnant with abdominal pain. Ambulatory, NAD

## 2018-01-03 ENCOUNTER — Ambulatory Visit (INDEPENDENT_AMBULATORY_CARE_PROVIDER_SITE_OTHER): Payer: Medicaid Other | Admitting: Obstetrics and Gynecology

## 2018-01-03 ENCOUNTER — Encounter: Payer: Self-pay | Admitting: Obstetrics and Gynecology

## 2018-01-03 VITALS — BP 110/80 | Wt 184.0 lb

## 2018-01-03 DIAGNOSIS — O99211 Obesity complicating pregnancy, first trimester: Secondary | ICD-10-CM

## 2018-01-03 DIAGNOSIS — O34219 Maternal care for unspecified type scar from previous cesarean delivery: Secondary | ICD-10-CM

## 2018-01-03 DIAGNOSIS — O0990 Supervision of high risk pregnancy, unspecified, unspecified trimester: Secondary | ICD-10-CM

## 2018-01-03 DIAGNOSIS — Z1379 Encounter for other screening for genetic and chromosomal anomalies: Secondary | ICD-10-CM

## 2018-01-03 DIAGNOSIS — Z3A12 12 weeks gestation of pregnancy: Secondary | ICD-10-CM

## 2018-01-03 DIAGNOSIS — Z6833 Body mass index (BMI) 33.0-33.9, adult: Secondary | ICD-10-CM

## 2018-01-03 NOTE — Progress Notes (Signed)
Routine Prenatal Care Visit  Subjective  Ashley Hunter is a 32 y.o. G2P1001 at [redacted]w[redacted]d being seen today for ongoing prenatal care.  She is currently monitored for the following issues for this high-risk pregnancy and has History of cesarean delivery, currently pregnant; Supervision of high risk pregnancy, antepartum; Obesity affecting pregnancy; and BMI 33.0-33.9,adult on their problem list.  ----------------------------------------------------------------------------------- Patient reports no complaints.    . Vag. Bleeding: None.  Movement: Absent. Denies leaking of fluid.  ----------------------------------------------------------------------------------- The following portions of the patient's history were reviewed and updated as appropriate: allergies, current medications, past family history, past medical history, past social history, past surgical history and problem list. Problem list updated.   Objective  Blood pressure 110/80, weight 184 lb (83.5 kg), last menstrual period 10/05/2017, unknown if currently breastfeeding. Pregravid weight 179 lb (81.2 kg) Total Weight Gain 5 lb (2.268 kg) Urinalysis: Urine Protein    Urine Glucose    Fetal Status: Fetal Heart Rate (bpm): present   Movement: Absent     General:  Alert, oriented and cooperative. Patient is in no acute distress.  Skin: Skin is warm and dry. No rash noted.   Cardiovascular: Normal heart rate noted  Respiratory: Normal respiratory effort, no problems with respiration noted  Abdomen: Soft, gravid, appropriate for gestational age. Pain/Pressure: Absent     Pelvic:  Cervical exam deferred        Extremities: Normal range of motion.     Mental Status: Normal mood and affect. Normal behavior. Normal judgment and thought content.   Assessment   32 y.o. G2P1001 at [redacted]w[redacted]d by  07/12/2018, by Last Menstrual Period presenting for routine prenatal visit  Plan   pregnancy 2 Problems (from 10/05/17 to present)    Problem  Noted Resolved   BMI 33.0-33.9,adult 12/06/2017 by Conard Novak, MD No   History of cesarean delivery, currently pregnant 11/21/2017 by Tresea Mall, CNM No   Supervision of high risk pregnancy, antepartum 11/21/2017 by Tresea Mall, CNM No   Overview Signed 11/21/2017  3:09 PM by Tresea Mall, CNM    [facility]inic [facility] Prenatal Labs  Dating  Blood type:     Genetic Screen 1 Screen:    AFP:     Quad:     NIPS: Antibody:   Anatomic [place]  Rubella:   Varicella: @VZVIGG @  GTT Early:               Third trimester:  RPR:     Rhogam  HBsAg:     TDaP vaccine                       Flu Shot: HIV:     Baby Food                                GBS:   Contraception  Pap:  CBB     CS/VBAC Primary breech/oligo 2017   Support Person Husband Hemal           Obesity affecting pregnancy 11/21/2017 by Tresea Mall, CNM No       Preterm labor symptoms and general obstetric precautions including but not limited to vaginal bleeding, contractions, leaking of fluid and fetal movement were reviewed in detail with the patient. Please refer to After Visit Summary for other counseling recommendations.   Return in about 3 days (around 01/06/2018) for U/S for NT, 1h gtt (needs J[employer]) and NOB  labs, ROB.  Thomasene MohairStephen Jennavieve Arrick, MD, Merlinda FrederickFACOG [facility], [facility] 01/03/2018 11:09 AM

## 2018-01-07 ENCOUNTER — Other Ambulatory Visit: Payer: Medicaid Other

## 2018-01-07 ENCOUNTER — Encounter: Payer: Medicaid Other | Admitting: Obstetrics and Gynecology

## 2018-01-13 ENCOUNTER — Ambulatory Visit (INDEPENDENT_AMBULATORY_CARE_PROVIDER_SITE_OTHER): Payer: Medicaid Other | Admitting: Obstetrics and Gynecology

## 2018-01-13 ENCOUNTER — Encounter: Payer: Self-pay | Admitting: Obstetrics and Gynecology

## 2018-01-13 ENCOUNTER — Other Ambulatory Visit: Payer: Medicaid Other

## 2018-01-13 VITALS — BP 118/74 | Wt 183.0 lb

## 2018-01-13 DIAGNOSIS — O34219 Maternal care for unspecified type scar from previous cesarean delivery: Secondary | ICD-10-CM

## 2018-01-13 DIAGNOSIS — Z113 Encounter for screening for infections with a predominantly sexual mode of transmission: Secondary | ICD-10-CM

## 2018-01-13 DIAGNOSIS — O99212 Obesity complicating pregnancy, second trimester: Secondary | ICD-10-CM

## 2018-01-13 DIAGNOSIS — O0990 Supervision of high risk pregnancy, unspecified, unspecified trimester: Secondary | ICD-10-CM

## 2018-01-13 DIAGNOSIS — Z1379 Encounter for other screening for genetic and chromosomal anomalies: Secondary | ICD-10-CM

## 2018-01-13 DIAGNOSIS — Z3A14 14 weeks gestation of pregnancy: Secondary | ICD-10-CM

## 2018-01-13 DIAGNOSIS — Z6833 Body mass index (BMI) 33.0-33.9, adult: Secondary | ICD-10-CM

## 2018-01-13 DIAGNOSIS — O99210 Obesity complicating pregnancy, unspecified trimester: Secondary | ICD-10-CM

## 2018-01-13 NOTE — Progress Notes (Signed)
Routine Prenatal Care Visit  Subjective  Ashley Hunter is a 32 y.o. G2P1001 at 3763w2d being seen today for ongoing prenatal care.  She is currently monitored for the following issues for this low-risk pregnancy and has History of cesarean delivery, currently pregnant; Supervision of high risk pregnancy, antepartum; Obesity affecting pregnancy; and BMI 33.0-33.9,adult on their problem list.  ----------------------------------------------------------------------------------- Patient reports no complaints.    . Vag. Bleeding: None.  Movement: Absent. Denies leaking of fluid.  ----------------------------------------------------------------------------------- The following portions of the patient's history were reviewed and updated as appropriate: allergies, current medications, past family history, past medical history, past social history, past surgical history and problem list. Problem list updated.   Objective  Blood pressure 118/74, weight 183 lb (83 kg), last menstrual period 10/05/2017, unknown if currently breastfeeding. Pregravid weight 179 lb (81.2 kg) Total Weight Gain 4 lb (1.814 kg) Urinalysis: Urine Protein    Urine Glucose    Fetal Status: Fetal Heart Rate (bpm): present   Movement: Absent     General:  Alert, oriented and cooperative. Patient is in no acute distress.  Skin: Skin is warm and dry. No rash noted.   Cardiovascular: Normal heart rate noted  Respiratory: Normal respiratory effort, no problems with respiration noted  Abdomen: Soft, gravid, appropriate for gestational age. Pain/Pressure: Absent     Pelvic:  Cervical exam deferred        Extremities: Normal range of motion.  Edema: None  Mental Status: Normal mood and affect. Normal behavior. Normal judgment and thought content.   Assessment   32 y.o. G2P1001 at 4363w2d by  07/12/2018, by Last Menstrual Period presenting for routine prenatal visit  Plan   pregnancy 2 Problems (from 10/05/17 to present)    Problem Noted Resolved   BMI 33.0-33.9,adult 12/06/2017 by Conard NovakJackson, Jaquala Fuller D, MD No   History of cesarean delivery, currently pregnant 11/21/2017 by Tresea MallGledhill, Jane, CNM No   Supervision of high risk pregnancy, antepartum 11/21/2017 by Tresea MallGledhill, Jane, CNM No   Overview Signed 11/21/2017  3:09 PM by Tresea MallGledhill, Jane, CNM    Clinic [facility] Prenatal Labs  Dating  Blood type:     Genetic Screen 1 Screen:    AFP:     Quad:     NIPS: Antibody:   Anatomic US  Rubella:   Varicella: @VZVIGG @  GTT Early:               Third trimester:  RPR:     Rhogam  HBsAg:     TDaP vaccine                       Flu Shot: HIV:     Baby Food                                GBS:   Contraception  Pap:  CBB     CS/VBAC Primary breech/oligo 2017   Support Person Husband Hemal           Obesity affecting pregnancy 11/21/2017 by Tresea MallGledhill, Jane, CNM No       Preterm labor symptoms and general obstetric precautions including but not limited to vaginal bleeding, contractions, leaking of fluid and fetal movement were reviewed in detail with the patient. Please refer to After Visit Summary for other counseling recommendations.   - NOB labs today - NIPT today - patient missed her 1h gtt today   Return in about  2 weeks (around 01/27/2018) for lab for 1h gtt and Routine Prenatal Appointment.  Thomasene Mohair, MD, [facility], [facility] 01/13/2018 3:33 PM

## 2018-01-13 NOTE — Patient Instructions (Signed)
Back Pain in Pregnancy Back pain during pregnancy is common. Back pain may be caused by several factors that are related to changes during your pregnancy. Follow these instructions at home: Managing pain, stiffness, and swelling  If directed, apply ice for sudden (acute) back pain. ? Put ice in a plastic bag. ? Place a towel between your skin and the bag. ? Leave the ice on for 20 minutes, 2-3 times per day.  If directed, apply heat to the affected area before you exercise: ? Place a towel between your skin and the heat pack or heating pad. ? Leave the heat on for 20-30 minutes. ? Remove the heat if your skin turns bright red. This is especially important if you are unable to feel pain, heat, or cold. You may have a greater risk of getting burned. Activity  Exercise as told by your health care provider. Exercising is the best way to prevent or manage back pain.  Listen to your body when lifting. If lifting hurts, ask for help or bend your knees. This uses your leg muscles instead of your back muscles.  Squat down when picking up something from the floor. Do not bend over.  Only use bed rest as told by your health care provider. Bed rest should only be used for the most severe episodes of back pain. Standing, Sitting, and Lying Down  Do not stand in one place for long periods of time.  Use good posture when sitting. Make sure your head rests over your shoulders and is not hanging forward. Use a pillow on your lower back if necessary.  Try sleeping on your side, preferably the left side, with a pillow or two between your legs. If you are sore after a night's rest, your bed may be too soft. A firm mattress may provide more support for your back during pregnancy. General instructions  Do not wear high heels.  Eat a healthy diet. Try to gain weight within your health care provider's recommendations.  Use a maternity girdle, elastic sling, or back brace as told by your health care  provider.  Take over-the-counter and prescription medicines only as told by your health care provider.  Keep all follow-up visits as told by your health care provider. This is important. This includes any visits with any specialists, such as a physical therapist. Contact a health care provider if:  Your back pain interferes with your daily activities.  You have increasing pain in other parts of your body. Get help right away if:  You develop numbness, tingling, weakness, or problems with the use of your arms or legs.  You develop severe back pain that is not controlled with medicine.  You have a sudden change in bowel or bladder control.  You develop shortness of breath, dizziness, or you faint.  You develop nausea, vomiting, or sweating.  You have back pain that is a rhythmic, cramping pain similar to labor pains. Labor pain is usually 1-2 minutes apart, lasts for about 1 minute, and involves a bearing down feeling or pressure in your pelvis.  You have back pain and your water breaks or you have vaginal bleeding.  You have back pain or numbness that travels down your leg.  Your back pain developed after you fell.  You develop pain on one side of your back.  You see blood in your urine.  You develop skin blisters in the area of your back pain. This information is not intended to replace advice given to you   by your health care provider. Make sure you discuss any questions you have with your health care provider. Document Released: 07/18/2005 Document Revised: 09/15/2015 Document Reviewed: 12/22/2014 Elsevier Interactive Patient Education  2018 [employer]vier Inc.  

## 2018-01-14 ENCOUNTER — Other Ambulatory Visit: Payer: Self-pay | Admitting: Advanced Practice Midwife

## 2018-01-14 DIAGNOSIS — O99280 Endocrine, nutritional and metabolic diseases complicating pregnancy, unspecified trimester: Principal | ICD-10-CM

## 2018-01-14 DIAGNOSIS — E039 Hypothyroidism, unspecified: Secondary | ICD-10-CM

## 2018-01-14 LAB — RPR+RH+ABO+RUB AB+AB SCR+CB...
Antibody Screen: NEGATIVE
HIV Screen 4th Generation wRfx: NONREACTIVE
Hematocrit: 36.3 % (ref 34.0–46.6)
Hemoglobin: 12.3 g/dL (ref 11.1–15.9)
Hepatitis B Surface Ag: NEGATIVE
MCH: 29.1 pg (ref 26.6–33.0)
MCHC: 33.9 g/dL (ref 31.5–35.7)
MCV: 86 fL (ref 79–97)
PLATELETS: 270 10*3/uL (ref 150–450)
RBC: 4.23 x10E6/uL (ref 3.77–5.28)
RDW: 13 % (ref 12.3–15.4)
RPR Ser Ql: NONREACTIVE
Rh Factor: POSITIVE
Rubella Antibodies, IGG: 19.3 index (ref >0.99)
Varicella zoster IgG: 1031 index (ref >165)
WBC: 10.2 10*3/uL (ref 3.4–10.8)

## 2018-01-14 LAB — THYROID PANEL WITH TSH
FREE THYROXINE INDEX: 3.7 (ref 1.2–4.9)
T3 UPTAKE RATIO: 30 % (ref 24–39)
T4 TOTAL: 12.3 ug/dL — AB (ref 4.5–12.0)
TSH: 0.153 u[IU]/mL — AB (ref 0.450–4.500)

## 2018-01-14 MED ORDER — LEVOTHYROXINE SODIUM 125 MCG PO TABS: 125.0000 ug | ORAL_TABLET | Freq: Every day | ORAL | 11 refills | Status: DC

## 2018-01-14 NOTE — Progress Notes (Signed)
Dose decrease due to TSH level 0.153

## 2018-01-14 NOTE — Progress Notes (Signed)
Spoke with patient regarding new dose of synthroid. She will pick it up at the pharmacy.

## 2018-01-18 LAB — MATERNIT 21 PLUS CORE, BLOOD
CHROMOSOME 13: NEGATIVE
CHROMOSOME 18: NEGATIVE
CHROMOSOME 21: NEGATIVE
Y Chromosome: DETECTED

## 2018-01-28 ENCOUNTER — Ambulatory Visit (INDEPENDENT_AMBULATORY_CARE_PROVIDER_SITE_OTHER): Payer: Medicaid Other | Admitting: Obstetrics and Gynecology

## 2018-01-28 ENCOUNTER — Other Ambulatory Visit: Payer: Medicaid Other

## 2018-01-28 ENCOUNTER — Encounter: Payer: Self-pay | Admitting: Obstetrics and Gynecology

## 2018-01-28 VITALS — BP 118/84 | Wt 185.0 lb

## 2018-01-28 DIAGNOSIS — O99210 Obesity complicating pregnancy, unspecified trimester: Secondary | ICD-10-CM

## 2018-01-28 DIAGNOSIS — Z3A16 16 weeks gestation of pregnancy: Secondary | ICD-10-CM

## 2018-01-28 DIAGNOSIS — Z6833 Body mass index (BMI) 33.0-33.9, adult: Secondary | ICD-10-CM

## 2018-01-28 DIAGNOSIS — O0990 Supervision of high risk pregnancy, unspecified, unspecified trimester: Secondary | ICD-10-CM

## 2018-01-28 DIAGNOSIS — O34219 Maternal care for unspecified type scar from previous cesarean delivery: Secondary | ICD-10-CM

## 2018-01-28 DIAGNOSIS — O99212 Obesity complicating pregnancy, second trimester: Secondary | ICD-10-CM

## 2018-01-28 MED ORDER — CONCEPT DHA 53.5-38-1 MG PO CAPS: 1.0000 | ORAL_CAPSULE | Freq: Every day | ORAL | 11 refills | Status: AC

## 2018-01-28 NOTE — Progress Notes (Signed)
Routine Prenatal Care Visit  Subjective  Ashley Hunter is a 32 y.o. G2P1001 at [redacted]w[redacted]d being seen today for ongoing prenatal care.  She is currently monitored for the following issues for this high-risk pregnancy and has History of cesarean delivery, currently pregnant; Supervision of high risk pregnancy, antepartum; Obesity affecting pregnancy; and BMI 33.0-33.9,adult on their problem list.  ----------------------------------------------------------------------------------- Patient reports no complaints.    . Vag. Bleeding: None.  Movement: Absent. Denies leaking of fluid.  ----------------------------------------------------------------------------------- The following portions of the patient's history were reviewed and updated as appropriate: allergies, current medications, past family history, past medical history, past social history, past surgical history and problem list. Problem list updated.   Objective  Blood pressure 118/84, weight 185 lb (83.9 kg), last menstrual period 10/05/2017, unknown if currently breastfeeding. Pregravid weight 179 lb (81.2 kg) Total Weight Gain 6 lb (2.722 kg) Urinalysis: Urine Protein    Urine Glucose    Fetal Status: Fetal Heart Rate (bpm): 140   Movement: Absent     General:  Alert, oriented and cooperative. Patient is in no acute distress.  Skin: Skin is warm and dry. No rash noted.   Cardiovascular: Normal heart rate noted  Respiratory: Normal respiratory effort, no problems with respiration noted  Abdomen: Soft, gravid, appropriate for gestational age. Pain/Pressure: Absent     Pelvic:  Cervical exam deferred        Extremities: Normal range of motion.  Edema: None  Mental Status: Normal mood and affect. Normal behavior. Normal judgment and thought content.   Assessment   32 y.o. G2P1001 at [redacted]w[redacted]d by  07/12/2018, by Last Menstrual Period presenting for routine prenatal visit  Plan   pregnancy 2 Problems (from 10/05/17 to present)    Problem Noted Resolved   BMI 33.0-33.9,adult 12/06/2017 by Conard Novak, MD No   History of cesarean delivery, currently pregnant 11/21/2017 by Tresea Mall, CNM No   Supervision of high risk pregnancy, antepartum 11/21/2017 by Tresea Mall, CNM No   Overview Signed 11/21/2017  3:09 PM by Tresea Mall, CNM    Clinic [facility] Prenatal Labs  Dating  Blood type:     Genetic Screen 1 Screen:    AFP:     Quad:     NIPS: Antibody:   Anatomic [place]  Rubella:   Varicella: @VZVIGG @  GTT Early:               Third trimester:  RPR:     Rhogam  HBsAg:     TDaP vaccine                       Flu Shot: HIV:     Baby Food                                GBS:   Contraception  Pap:  CBB     CS/VBAC Primary breech/oligo 2017   Support Person Husband Hemal         Obesity affecting pregnancy 11/21/2017 by Tresea Mall, CNM No       Preterm labor symptoms and general obstetric precautions including but not limited to vaginal bleeding, contractions, leaking of fluid and fetal movement were reviewed in detail with the patient. Please refer to After Visit Summary for other counseling recommendations.   - repeat thyroid labs next visit - anatomy u/s next visit  Return in about 4 weeks (around 02/25/2018) for Anatomy  u/s and routine prenatal.  Thomasene Mohair, MD, [facility], [facility] 01/28/2018 3:34 PM

## 2018-01-29 LAB — GLUCOSE, 1 HOUR GESTATIONAL: Gestational Diabetes Screen: 110 mg/dL (ref 65–139)

## 2018-02-04 ENCOUNTER — Other Ambulatory Visit: Payer: Self-pay | Admitting: Obstetrics and Gynecology

## 2018-02-04 MED ORDER — ONDANSETRON 4 MG PO TBDP: 4.0000 mg | ORAL_TABLET | Freq: Four times a day (QID) | ORAL | 0 refills | Status: DC | PRN

## 2018-02-04 NOTE — Progress Notes (Signed)
Spoke with patient's husband.  Has had nausea and emesis for past 3 days.  Trial of Zofran ODT called in.  Discussed if worsening of symptoms or additional symptoms develop may need evaluation in ED.

## 2018-02-25 ENCOUNTER — Encounter: Payer: Self-pay | Admitting: Obstetrics and Gynecology

## 2018-02-25 ENCOUNTER — Ambulatory Visit (INDEPENDENT_AMBULATORY_CARE_PROVIDER_SITE_OTHER): Payer: Medicaid Other

## 2018-02-25 ENCOUNTER — Ambulatory Visit (INDEPENDENT_AMBULATORY_CARE_PROVIDER_SITE_OTHER): Payer: Medicaid Other | Admitting: Obstetrics and Gynecology

## 2018-02-25 VITALS — BP 124/74 | Wt 185.0 lb

## 2018-02-25 DIAGNOSIS — O99282 Endocrine, nutritional and metabolic diseases complicating pregnancy, second trimester: Secondary | ICD-10-CM

## 2018-02-25 DIAGNOSIS — Z3A20 20 weeks gestation of pregnancy: Secondary | ICD-10-CM

## 2018-02-25 DIAGNOSIS — O26842 Uterine size-date discrepancy, second trimester: Secondary | ICD-10-CM

## 2018-02-25 DIAGNOSIS — Z6833 Body mass index (BMI) 33.0-33.9, adult: Secondary | ICD-10-CM

## 2018-02-25 DIAGNOSIS — O0990 Supervision of high risk pregnancy, unspecified, unspecified trimester: Secondary | ICD-10-CM

## 2018-02-25 DIAGNOSIS — E039 Hypothyroidism, unspecified: Secondary | ICD-10-CM

## 2018-02-25 DIAGNOSIS — O99212 Obesity complicating pregnancy, second trimester: Secondary | ICD-10-CM

## 2018-02-25 DIAGNOSIS — O26849 Uterine size-date discrepancy, unspecified trimester: Secondary | ICD-10-CM | POA: Insufficient documentation

## 2018-02-25 DIAGNOSIS — Z363 Encounter for antenatal screening for malformations: Secondary | ICD-10-CM

## 2018-02-25 DIAGNOSIS — O34219 Maternal care for unspecified type scar from previous cesarean delivery: Secondary | ICD-10-CM

## 2018-02-25 DIAGNOSIS — O219 Vomiting of pregnancy, unspecified: Secondary | ICD-10-CM

## 2018-02-25 DIAGNOSIS — O99280 Endocrine, nutritional and metabolic diseases complicating pregnancy, unspecified trimester: Secondary | ICD-10-CM

## 2018-02-25 MED ORDER — VITAMIN B-6 25 MG PO TABS: 25.0000 mg | ORAL_TABLET | Freq: Three times a day (TID) | ORAL | 3 refills | Status: DC

## 2018-02-25 NOTE — Progress Notes (Signed)
Routine Prenatal Care Visit  Subjective  Ashley Hunter is a 32 y.o. G2P1001 at [redacted]w[redacted]d being seen today for ongoing prenatal care.  She is currently monitored for the following issues for this high-risk pregnancy and has History of cesarean delivery, currently pregnant; Supervision of high risk pregnancy, antepartum; Obesity affecting pregnancy; BMI 33.0-33.9,adult; Fetal size inconsistent with dates; and Hypothyroid in pregnancy, antepartum on their problem list.  ----------------------------------------------------------------------------------- Patient reports ongoing nausea.  She is taking Zofran.  However, the medication makes her sleepy.    . Vag. Bleeding: None.  Movement: Present. Denies leaking of fluid.  Ultrasound today: Visualized anatomy appears normal.  However, the fetal measurements are about 12 days off her estimated date of delivery, which is greater than the anticipated difference of 10 days difference.  Not all anatomy could be visualized. ----------------------------------------------------------------------------------- The following portions of the patient's history were reviewed and updated as appropriate: allergies, current medications, past family history, past medical history, past social history, past surgical history and problem list. Problem list updated.   Objective  Blood pressure 124/74, weight 185 lb (83.9 kg), last menstrual period 10/05/2017, unknown if currently breastfeeding. Pregravid weight 179 lb (81.2 kg) Total Weight Gain 6 lb (2.722 kg) Urinalysis: Urine Protein    Urine Glucose    Fetal Status: Fetal Heart Rate (bpm): Present   Movement: Present     General:  Alert, oriented and cooperative. Patient is in no acute distress.  Skin: Skin is warm and dry. No rash noted.   Cardiovascular: Normal heart rate noted  Respiratory: Normal respiratory effort, no problems with respiration noted  Abdomen: Soft, gravid, appropriate for gestational age.  Pain/Pressure: Absent     Pelvic:  Cervical exam deferred        Extremities: Normal range of motion.  Edema: None  Mental Status: Normal mood and affect. Normal behavior. Normal judgment and thought content.   Imaging results: US Ob Comp + 14 Wk  Result Date: 02/25/2018 Patient Name: Ashley Hunter DOB: July 06, 1985 MRN: [id number] ULTRASOUND REPORT Location: [facility] Date of Service: 02/25/2018 Indications:Anatomy Ultrasound Findings: Mason Jim intrauterine pregnancy is visualized with FHR at 154 BPM. Biometrics give an (U/S) Gestational age of [redacted]w[redacted]d and an (U/S) EDD of 07/24/2018; this does not correlate with the clinically established Estimated Date of Delivery: 07/12/18 Fetal presentation is Cephalic. EFW: 249g (9oz). Placenta: anterior. Grade: 0 AFI: subjectively normal. Anatomic survey is incomplete for 4 chamber heart/outflow tracts and profile due to position; Gender - not seen Right Ovary- simple cyst seen. Left Ovary is normal appearance. Survey of the adnexa demonstrates no adnexal masses. There is no free peritoneal fluid in the cul de sac. Impression: 1. [redacted]w[redacted]d Viable Singleton Intrauterine pregnancy by U/S. 2. (U/S) EDD is not consistent with Clinically established Estimated Date of Delivery: 07/12/18 4. Dilated left kidney. 3. Follow up cardiac views and profile Recommendations: 1.Clinical correlation with the patient's History and Physical Exam. 2. Recommend ultrasound with MFM for size discrepency and dilated left renal hydronephrosis Darlina Guys, RDMS RVT There is a singleton gestation with subjectively normal amniotic fluid volume. The fetal biometry correlates with established dating. Detailed evaluation of the fetal anatomy was performed.The fetal anatomical survey appears within normal limits within the resolution of ultrasound as described above.  Not all structures were able to be visualized on today's study.  It is recommended that a follow-up ultrasound be performed to complete  visualization of the unobserved structures today.  It must be noted that a normal ultrasound  is unable to rule out fetal aneuploidy nor is it able to detect all possible malformations.    The ultrasound images and findings were reviewed by me and I agree with the above report. Thomasene Mohair, MD, [facility], [facility] 02/25/2018 4:56 PM     Assessment   32 y.o. G2P1001 at [redacted]w[redacted]d by  07/12/2018, by Last Menstrual Period presenting for routine prenatal visit  Plan   pregnancy 2 Problems (from 10/05/17 to present)    Problem Noted Resolved   Fetal size inconsistent with dates 02/25/2018 by Conard Novak, MD No   Overview Signed 02/25/2018  6:13 PM by Conard Novak, MD    -Ultrasound on 02/25/2018 gives EDD of 07/24/2018.  Her EDD based on an early ultrasound is 07/12/2018.  Given a greater than 10-day discrepancy, will refer to [facility] for further evaluation and completion of her anatomy ultrasound.      Hypothyroid in pregnancy, antepartum 02/25/2018 by Conard Novak, MD No   BMI 33.0-33.9,adult 12/06/2017 by Conard Novak, MD No   History of cesarean delivery, currently pregnant 11/21/2017 by Tresea Mall, CNM No   Supervision of high risk pregnancy, antepartum 11/21/2017 by Tresea Mall, CNM No   Overview Signed 11/21/2017  3:09 PM by Tresea Mall, CNM    Clinic [facility] Prenatal Labs  Dating  Blood type:     Genetic Screen 1 Screen:    AFP:     Quad:     NIPS: Antibody:   Anatomic [place]  Rubella:   Varicella: @VZVIGG @  GTT Early:               Third trimester:  RPR:     Rhogam  HBsAg:     TDaP vaccine                       Flu Shot: HIV:     Baby Food                                GBS:   Contraception  Pap:  CBB     CS/VBAC Primary breech/oligo 2017   Support Person Husband Hemal         Obesity affecting pregnancy 11/21/2017 by Tresea Mall, CNM No       Preterm labor symptoms and general obstetric precautions including but not limited to  vaginal bleeding, contractions, leaking of fluid and fetal movement were reviewed in detail with the patient. Please refer to After Visit Summary for other counseling recommendations.   -Referral made to [facility] to assess fetal growth and complete anatomy. -We will get thyroid labs to assess response to change in Synthroid dose. -Will prescribe vitamin B6 to see if this helps with her nausea as Zofran makes her sleepy and all other nausea medications also are associated with drowsiness.  Return in about 4 weeks (around 03/25/2018) for Routine Prenatal Appointment.  Thomasene Mohair, MD, [facility], [facility] 02/25/2018 6:17 PM

## 2018-02-27 ENCOUNTER — Other Ambulatory Visit: Payer: Medicaid Other

## 2018-02-27 DIAGNOSIS — E039 Hypothyroidism, unspecified: Secondary | ICD-10-CM

## 2018-02-27 DIAGNOSIS — O99280 Endocrine, nutritional and metabolic diseases complicating pregnancy, unspecified trimester: Secondary | ICD-10-CM

## 2018-02-27 DIAGNOSIS — O0990 Supervision of high risk pregnancy, unspecified, unspecified trimester: Secondary | ICD-10-CM

## 2018-02-28 ENCOUNTER — Other Ambulatory Visit: Payer: Self-pay | Admitting: Obstetrics and Gynecology

## 2018-02-28 DIAGNOSIS — Z3689 Encounter for other specified antenatal screening: Secondary | ICD-10-CM

## 2018-02-28 LAB — TSH+FREE T4
Free T4: 1.18 ng/dL (ref 0.82–1.77)
TSH: 2.55 u[IU]/mL (ref 0.450–4.500)

## 2018-03-10 ENCOUNTER — Other Ambulatory Visit: Payer: Self-pay

## 2018-03-10 DIAGNOSIS — O26849 Uterine size-date discrepancy, unspecified trimester: Secondary | ICD-10-CM

## 2018-03-13 ENCOUNTER — Ambulatory Visit (HOSPITAL_BASED_OUTPATIENT_CLINIC_OR_DEPARTMENT_OTHER)
Admission: RE | Admit: 2018-03-13 | Discharge: 2018-03-13 | Disposition: A | Discharge status: Home / Self Care | Payer: Medicaid Other | Source: Ambulatory Visit | Attending: Obstetrics and Gynecology | Admitting: Obstetrics and Gynecology

## 2018-03-13 ENCOUNTER — Ambulatory Visit
Admission: RE | Admit: 2018-03-13 | Discharge: 2018-03-13 | Disposition: A | Discharge status: Home / Self Care | Payer: Medicaid Other | Source: Ambulatory Visit | Attending: Obstetrics and Gynecology | Admitting: Obstetrics and Gynecology

## 2018-03-13 DIAGNOSIS — O358XX0 Maternal care for other (suspected) fetal abnormality and damage, not applicable or unspecified: Secondary | ICD-10-CM | POA: Diagnosis not present

## 2018-03-13 DIAGNOSIS — Z79890 Hormone replacement therapy (postmenopausal): Secondary | ICD-10-CM | POA: Insufficient documentation

## 2018-03-13 DIAGNOSIS — O99282 Endocrine, nutritional and metabolic diseases complicating pregnancy, second trimester: Secondary | ICD-10-CM | POA: Diagnosis not present

## 2018-03-13 DIAGNOSIS — O26849 Uterine size-date discrepancy, unspecified trimester: Secondary | ICD-10-CM

## 2018-03-13 DIAGNOSIS — E039 Hypothyroidism, unspecified: Secondary | ICD-10-CM | POA: Diagnosis not present

## 2018-03-13 DIAGNOSIS — Z833 Family history of diabetes mellitus: Secondary | ICD-10-CM | POA: Diagnosis not present

## 2018-03-13 DIAGNOSIS — O99212 Obesity complicating pregnancy, second trimester: Secondary | ICD-10-CM

## 2018-03-13 DIAGNOSIS — O99280 Endocrine, nutritional and metabolic diseases complicating pregnancy, unspecified trimester: Secondary | ICD-10-CM | POA: Diagnosis not present

## 2018-03-13 DIAGNOSIS — O35EXX0 Maternal care for other (suspected) fetal abnormality and damage, fetal genitourinary anomalies, not applicable or unspecified: Secondary | ICD-10-CM | POA: Insufficient documentation

## 2018-03-13 DIAGNOSIS — Z3689 Encounter for other specified antenatal screening: Secondary | ICD-10-CM

## 2018-03-13 DIAGNOSIS — O26842 Uterine size-date discrepancy, second trimester: Secondary | ICD-10-CM | POA: Diagnosis not present

## 2018-03-13 DIAGNOSIS — Z3A22 22 weeks gestation of pregnancy: Secondary | ICD-10-CM | POA: Insufficient documentation

## 2018-03-13 NOTE — Progress Notes (Signed)
[facility] Consultation   Chief Complaint: fetal growth issues? hypothyroidism in pregnancy   HPI: Ashley Hunter is a 32 y.o. G2P1001 at 7959w5d by lMP 6/15 - pt says  that she has "regular" menses " Every month it starts sometime between the 15th - 20th " No specific record from June  . [facility]  [facility]  for hypothyroidism in pregnancy , history of cesarean and concern for fetal growth- noted 12 d growth lag [facility]. Last TSH 2.5  Pt has a URI today   Past Medical History: Patient  has a past medical history of Hypothyroidism.  Past Surgical History: She  has a past surgical history that includes Appendectomy and Cesarean section (N/A, 04/27/2016).  Obstetric History:  OB History    Gravida  2   Para  1   Term  1   Preterm      AB      Living  1     SAB      TAB      Ectopic      Multiple      Live Births  1         [age] w [facility] 04[facility]27[facility]16 female 5 lbs 9oz A 8/9 The patient has been in otherwise good general health in the past. Healthy child   Gynecologic History:  lMP 6/15 - pt says  that she has "regular" menses " Every month it starts sometime between the 15th - 20th " No specific record from June  .   Medications:  Current Outpatient Medications:  .  levothyroxine (SYNTHROID, LEVOTHROID) 125 MCG tablet, Take 1 tablet (125 mcg total) by mouth daily before breakfast., Disp: 30 tablet, Rfl: 11 .  Prenatal Vit-Fe Fumarate-FA (PRENATAL MULTIVITAMIN) TABS tablet, Take 1 tablet by mouth daily at 12 noon., Disp: , Rfl:  Allergies: Patient has No Known Allergies.  Social History: Patient  reports that she has never smoked. She has never used smokeless tobacco. She reports that she does not drink alcohol or use drugs.  Family History: family history includes Diabetes in her paternal grandfather.  Review of Systems A full 12 point review of systems was negative or as noted in the  History of Present Illness.  Physical Exam: LMP 10/05/2017 (Exact Date)   Weight 181 lbs  Height 61 inches  BMI 34  112/75 hr 95  Temp 98.3 F  Well appearing overweight [place] female  See u/s     Asessement:  IUP at 22w 5d by LMP ( could be up to 5 days later )  - growth lag? - normal mat 21 , normotensiv e EFW today between 10-24th % ile depending on dating assignment - see u/s report  - HYpothyroidism on synthroid replacement - euthyroid on last TFTs on 11/7 with TSH of 2.5  - h/o cesarean for breech 2018  - maternal obesity- BMI 34 normal early GCT   Plan: - check growth in 3 weeks - [facility]  - check TSH  q trimester  - I suggested limiting weight gain - we did not discuss TOLAC    Total time spent with the patient was 30 minutes with greater than 50% spent in counseling and coordination of care. We appreciate this interesting consult and will be happy to be involved in the ongoing care of Ms. Calia in anyway her obstetricians desire.  Jimmey Ralph MD  Maternal-Fetal Medicine [facility]

## 2018-03-13 NOTE — Addendum Note (Signed)
Encounter addended by: Jimmey RalphLivingston, Brenly Trawick, MD on: 03/13/2018 4:29 PM  Actions taken: Visit diagnoses modified, Problem List modified

## 2018-03-26 ENCOUNTER — Ambulatory Visit (INDEPENDENT_AMBULATORY_CARE_PROVIDER_SITE_OTHER): Payer: Medicaid Other | Admitting: Obstetrics and Gynecology

## 2018-03-26 ENCOUNTER — Encounter: Payer: Self-pay | Admitting: Obstetrics and Gynecology

## 2018-03-26 VITALS — BP 126/70 | Wt 185.0 lb

## 2018-03-26 DIAGNOSIS — Z113 Encounter for screening for infections with a predominantly sexual mode of transmission: Secondary | ICD-10-CM

## 2018-03-26 DIAGNOSIS — O35EXX0 Maternal care for other (suspected) fetal abnormality and damage, fetal genitourinary anomalies, not applicable or unspecified: Secondary | ICD-10-CM

## 2018-03-26 DIAGNOSIS — E039 Hypothyroidism, unspecified: Secondary | ICD-10-CM

## 2018-03-26 DIAGNOSIS — Z131 Encounter for screening for diabetes mellitus: Secondary | ICD-10-CM

## 2018-03-26 DIAGNOSIS — O358XX0 Maternal care for other (suspected) fetal abnormality and damage, not applicable or unspecified: Secondary | ICD-10-CM

## 2018-03-26 DIAGNOSIS — O99280 Endocrine, nutritional and metabolic diseases complicating pregnancy, unspecified trimester: Secondary | ICD-10-CM

## 2018-03-26 DIAGNOSIS — O219 Vomiting of pregnancy, unspecified: Secondary | ICD-10-CM

## 2018-03-26 DIAGNOSIS — O99212 Obesity complicating pregnancy, second trimester: Secondary | ICD-10-CM

## 2018-03-26 DIAGNOSIS — Z6833 Body mass index (BMI) 33.0-33.9, adult: Secondary | ICD-10-CM

## 2018-03-26 DIAGNOSIS — Z3A24 24 weeks gestation of pregnancy: Secondary | ICD-10-CM

## 2018-03-26 DIAGNOSIS — O99282 Endocrine, nutritional and metabolic diseases complicating pregnancy, second trimester: Secondary | ICD-10-CM

## 2018-03-26 DIAGNOSIS — O0990 Supervision of high risk pregnancy, unspecified, unspecified trimester: Secondary | ICD-10-CM

## 2018-03-26 DIAGNOSIS — O34219 Maternal care for unspecified type scar from previous cesarean delivery: Secondary | ICD-10-CM

## 2018-03-26 NOTE — Progress Notes (Signed)
Routine Prenatal Care Visit  Subjective  Ashley Hunter is a 32 y.o. G2P1001 at [redacted]w[redacted]d being seen today for ongoing prenatal care.  She is currently monitored for the following issues for this high-risk pregnancy and has History of cesarean delivery, currently pregnant; Supervision of high risk pregnancy, antepartum; Obesity affecting pregnancy; BMI 33.0-33.9,adult; Fetal size inconsistent with dates; Hypothyroid in pregnancy, antepartum; Nausea and vomiting during pregnancy; and minimal left fetal renal pelviectasis  on their problem list.  ----------------------------------------------------------------------------------- Patient reports no complaints.    . Vag. Bleeding: None.  Movement: Present. Denies leaking of fluid.  ----------------------------------------------------------------------------------- The following portions of the patient's history were reviewed and updated as appropriate: allergies, current medications, past family history, past medical history, past social history, past surgical history and problem list. Problem list updated.   Objective  Blood pressure 126/70, weight 185 lb (83.9 kg), last menstrual period 10/05/2017, unknown if currently breastfeeding. Pregravid weight 179 lb (81.2 kg) Total Weight Gain 6 lb (2.722 kg) Urinalysis: Urine Protein    Urine Glucose    Fetal Status: Fetal Heart Rate (bpm): 145 Fundal Height: 24 cm Movement: Present     General:  Alert, oriented and cooperative. Patient is in no acute distress.  Skin: Skin is warm and dry. No rash noted.   Cardiovascular: Normal heart rate noted  Respiratory: Normal respiratory effort, no problems with respiration noted  Abdomen: Soft, gravid, appropriate for gestational age. Pain/Pressure: Absent     Pelvic:  Cervical exam deferred        Extremities: Normal range of motion.  Edema: None  Mental Status: Normal mood and affect. Normal behavior. Normal judgment and thought content.   Assessment    32 y.o. G2P1001 at [redacted]w[redacted]d by  07/12/2018, by Last Menstrual Period presenting for routine prenatal visit  Plan   pregnancy 2 Problems (from 10/05/17 to present)    Problem Noted Resolved   Fetal size inconsistent with dates 02/25/2018 by Conard Novak, MD No   Overview Addendum 03/13/2018 12:11 PM by Jimmey Ralph, MD    -Ultrasound on 02/25/2018 gives EDD of 07/24/2018.  Her EDD based on an early ultrasound is 07/12/2018.  Given a greater than 10-day discrepancy, will refer to [facility] for further evaluation and completion of her anatomy ultrasound. 03/13/18 - [facility] - EFW 10th percetnile by LMP , 24th by u/s dates - will f/u in 3 weeks       Hypothyroid in pregnancy, antepartum 02/25/2018 by Conard Novak, MD No   Nausea and vomiting during pregnancy 02/25/2018 by Conard Novak, MD No   BMI 33.0-33.9,adult 12/06/2017 by Conard Novak, MD No   History of cesarean delivery, currently pregnant 11/21/2017 by Tresea Mall, CNM No   Supervision of high risk pregnancy, antepartum 11/21/2017 by Tresea Mall, CNM No   Overview Signed 11/21/2017  3:09 PM by Tresea Mall, CNM    Clinic [facility] Prenatal Labs  Dating  Blood type:     Genetic Screen 1 Screen:    AFP:     Quad:     NIPS: Antibody:   Anatomic [place]  Rubella:   Varicella: @VZVIGG @  GTT Early:               Third trimester:  RPR:     Rhogam  HBsAg:     TDaP vaccine                       Flu Shot: HIV:  Baby Food                                GBS:   Contraception  Pap:  CBB     CS/VBAC Primary breech/oligo 2017   Support Person Husband Ashley Hunter           Obesity affecting pregnancy 11/21/2017 by Tresea MallGledhill, Jane, CNM No       Preterm labor symptoms and general obstetric precautions including but not limited to vaginal bleeding, contractions, leaking of fluid and fetal movement were reviewed in detail with the patient. Please refer to After Visit Summary for other counseling recommendations.   Return in  about 3 weeks (around 04/16/2018) for 28 week labs, routine prenatal.  Thomasene MohairStephen Nathali Vent, MD, Merlinda FrederickFACOG [facility], [facility] 03/26/2018 3:28 PM

## 2018-03-27 ENCOUNTER — Other Ambulatory Visit: Payer: Self-pay | Admitting: Obstetrics and Gynecology

## 2018-03-27 DIAGNOSIS — O99280 Endocrine, nutritional and metabolic diseases complicating pregnancy, unspecified trimester: Secondary | ICD-10-CM

## 2018-03-27 DIAGNOSIS — O0990 Supervision of high risk pregnancy, unspecified, unspecified trimester: Secondary | ICD-10-CM

## 2018-03-27 DIAGNOSIS — E039 Hypothyroidism, unspecified: Secondary | ICD-10-CM

## 2018-03-27 MED ORDER — LEVOTHYROXINE SODIUM 125 MCG PO TABS: 125.0000 ug | ORAL_TABLET | Freq: Every day | ORAL | 0 refills | Status: DC

## 2018-04-01 ENCOUNTER — Inpatient Hospital Stay
Admission: EM | Admit: 2018-04-01 | Discharge: 2018-04-01 | Disposition: A | Discharge status: Home / Self Care | Payer: Medicaid Other | Attending: Obstetrics and Gynecology | Admitting: Obstetrics and Gynecology

## 2018-04-01 ENCOUNTER — Other Ambulatory Visit: Payer: Self-pay

## 2018-04-01 DIAGNOSIS — O99212 Obesity complicating pregnancy, second trimester: Secondary | ICD-10-CM | POA: Insufficient documentation

## 2018-04-01 DIAGNOSIS — O34219 Maternal care for unspecified type scar from previous cesarean delivery: Secondary | ICD-10-CM

## 2018-04-01 DIAGNOSIS — O219 Vomiting of pregnancy, unspecified: Secondary | ICD-10-CM

## 2018-04-01 DIAGNOSIS — O26849 Uterine size-date discrepancy, unspecified trimester: Secondary | ICD-10-CM

## 2018-04-01 DIAGNOSIS — B9689 Other specified bacterial agents as the cause of diseases classified elsewhere: Secondary | ICD-10-CM | POA: Insufficient documentation

## 2018-04-01 DIAGNOSIS — O0990 Supervision of high risk pregnancy, unspecified, unspecified trimester: Secondary | ICD-10-CM

## 2018-04-01 DIAGNOSIS — O99282 Endocrine, nutritional and metabolic diseases complicating pregnancy, second trimester: Secondary | ICD-10-CM | POA: Insufficient documentation

## 2018-04-01 DIAGNOSIS — Z3A25 25 weeks gestation of pregnancy: Secondary | ICD-10-CM | POA: Insufficient documentation

## 2018-04-01 DIAGNOSIS — O99280 Endocrine, nutritional and metabolic diseases complicating pregnancy, unspecified trimester: Secondary | ICD-10-CM

## 2018-04-01 DIAGNOSIS — E039 Hypothyroidism, unspecified: Secondary | ICD-10-CM | POA: Diagnosis not present

## 2018-04-01 DIAGNOSIS — O23592 Infection of other part of genital tract in pregnancy, second trimester: Secondary | ICD-10-CM | POA: Insufficient documentation

## 2018-04-01 DIAGNOSIS — Z6833 Body mass index (BMI) 33.0-33.9, adult: Secondary | ICD-10-CM

## 2018-04-01 LAB — URINALYSIS, ROUTINE W REFLEX MICROSCOPIC
Bilirubin Urine: NEGATIVE
Glucose, UA: NEGATIVE mg/dL
Hgb urine dipstick: NEGATIVE
Ketones, ur: NEGATIVE mg/dL
LEUKOCYTES UA: NEGATIVE
Nitrite: NEGATIVE
PROTEIN: NEGATIVE mg/dL
SPECIFIC GRAVITY, URINE: 1.002 — AB (ref 1.005–1.030)
pH: 7 (ref 5.0–8.0)

## 2018-04-01 LAB — WET PREP, GENITAL
SPERM: NONE SEEN
Trich, Wet Prep: NONE SEEN
YEAST WET PREP: NONE SEEN

## 2018-04-01 MED ORDER — METRONIDAZOLE 0.75 % VA GEL: 1.0000 | Freq: Two times a day (BID) | VAGINAL | 0 refills | Status: DC

## 2018-04-01 NOTE — OB Triage Note (Signed)
Pt presents c/o intermittent lower abdominal pain that started 2 days ago rating 3-4 out of 10. and some light bleeding when using the restroom. She does not have to wear a pad and only notices when she goes to the bathroom. Denies any LOF. Reports positive fetal movement. Vitals WNL. Will continue to monitor.

## 2018-04-01 NOTE — Discharge Instructions (Signed)

## 2018-04-01 NOTE — H&P (Signed)
Triage Note  Ashley Hunter is an 32 y.o. female.  HPI: Patient presented to labor and delivery for blood seen when wiping. No pain with urination. No contractions. No leakage of fluid. Feeling fetal movement. Has not had intercourse since June. She did not have a bowel movement before she noticed the bleeding. She denies discharge. Denies vulvar or vaginal itching.    Her pregnancy has been complicated by a low fetal weight suspected to be 10% which is being followed by MFM and hypothyroidism. Her last TSH was normal.   pregnancy 2 Problems (from 10/05/17 to present)    Problem Noted Resolved   Fetal size inconsistent with dates 02/25/2018 by Conard Novak, MD No   Overview Addendum 03/13/2018 12:11 PM by Jimmey Ralph, MD    -Ultrasound on 02/25/2018 gives EDD of 07/24/2018.  Her EDD based on an early ultrasound is 07/12/2018.  Given a greater than 10-day discrepancy, will refer to [facility] for further evaluation and completion of her anatomy ultrasound. 03/13/18 - [facility] - EFW 10th percetnile by LMP , 24th by u/s dates - will f/u in 3 weeks       Hypothyroid in pregnancy, antepartum 02/25/2018 by Conard Novak, MD No   Nausea and vomiting during pregnancy 02/25/2018 by Conard Novak, MD No   BMI 33.0-33.9,adult 12/06/2017 by Conard Novak, MD No   History of cesarean delivery, currently pregnant 11/21/2017 by Tresea Mall, CNM No   Supervision of high risk pregnancy, antepartum 11/21/2017 by Tresea Mall, CNM No   Overview Addendum 04/01/2018  7:22 PM by Natale Milch, MD    [facility]inic [facility] Prenatal Labs  Dating  LMP = 8wk [place] Blood type: B/Positive/-- (09/23 1536)   Genetic Screen  NIPS: normal XX Antibody:Negative (09/23 1536)  Anatomic [place] Incomplete heart views,  gorwth 10% at 22 wks Rubella: 19.30 (09/23 1536) Varicella: immune  GTT Early: 110              Third trimester:  RPR: Non Reactive (09/23 1536)   Rhogam  HBsAg: Negative (09/23 1536)   TDaP  vaccine                       Flu Shot: HIV: Non Reactive (09/23 1536)   Baby Food                                GBS:   Contraception  Pap: [ ]    CBB     CS/VBAC Primary breech/oligo 2017   Support Person Husband Hemal           Obesity affecting pregnancy 11/21/2017 by Tresea Mall, CNM No        Past Medical History:  Diagnosis Date  . Hypothyroidism     Past Surgical History:  Procedure Laterality Date  . APPENDECTOMY    . CESAREAN SECTION N/A 04/27/2016   Procedure: CESAREAN SECTION FOR BREECH PRESENTATION;  Surgeon: Conard Novak, MD;  Location: [facility] ORS;  Service: Obstetrics;  Laterality: N/A;  Baby girl born @ 1300 Apgars: 8/9 Weight: 5lbs 9ozs    Family History  Problem Relation Age of Onset  . Diabetes Paternal Grandfather     Social History:  reports that she has never smoked. She has never used smokeless tobacco. She reports that she does not drink alcohol or use drugs.  Allergies: No Known Allergies  Medications: I have  reviewed the patient's current medications.  No results found for this or any previous visit (from the past 48 hour(s)).  No results found.  Review of Systems  Constitutional: Negative for chills, fever, malaise/fatigue and weight loss.  HENT: Negative for congestion, hearing loss and sinus pain.   Eyes: Negative for blurred vision and double vision.  Respiratory: Negative for cough, sputum production, shortness of breath and wheezing.   Cardiovascular: Negative for chest pain, palpitations, orthopnea and leg swelling.  Gastrointestinal: Negative for abdominal pain, constipation, diarrhea, nausea and vomiting.  Genitourinary: Negative for dysuria, flank pain, frequency, hematuria and urgency.  Musculoskeletal: Negative for back pain, falls and joint pain.  Skin: Negative for itching and rash.  Neurological: Negative for dizziness and headaches.  Psychiatric/Behavioral: Negative for depression, substance abuse and suicidal ideas.  The patient is not nervous/anxious.    Blood pressure 119/71, pulse 82, temperature (!) 97.4 F (36.3 C), temperature source Oral, resp. rate 18, height 5\' 1"  (1.549 m), weight 83.9 kg, last menstrual period 10/05/2017, SpO2 98 %, unknown if currently breastfeeding. Physical Exam  Nursing note and vitals reviewed. Constitutional: She is oriented to person, place, and time. She appears well-developed and well-nourished.  HENT:  Head: Normocephalic and atraumatic.  Cardiovascular: Normal rate and regular rhythm.  Respiratory: Effort normal and breath sounds normal.  GI: Soft. Bowel sounds are normal.  Genitourinary:     Genitourinary Comments: Normal speculum exam. Normal appearing cervix. No cervical dilation, visually closed, minimal scant normal discharge.   Musculoskeletal: Normal range of motion.  Neurological: She is alert and oriented to person, place, and time.  Skin: Skin is warm and dry.  Psychiatric: She has a normal mood and affect. Her behavior is normal. Judgment and thought content normal.   Wet Prep: Clue Cells: Negative Fungal elements: Negative Trichomonas: Negative  Assessment/Plan: 32 yo G2P1001 3046w3d 1.  Vaginal spotting- no evidence of bleeding on examination. Wet prep suggests BV, will treat with flagyl- no fungal infection. No association with intercourse or bowel movements. No pain with urination. UA normal.  Reassurance, follow up in office as planned. Rh positive blood type.    R  04/01/2018, 7:23 PM

## 2018-04-01 NOTE — Discharge Summary (Signed)
Physician Discharge Summary   Patient ID: Ashley LukesHarshana J Fludd [id number] 32 y.o. [birth year]  Admit date: 04/01/2018  Discharge date and time: No discharge date for patient encounter.   Admitting Physician: Natale Milchhristanna R Hasana Alcorta, MD   Discharge Physician: Adelene Idlerhristanna Earl Zellmer MD  Admission Diagnoses: 24 weeks preg vag bleed  Discharge Diagnoses: Bacterial vaginosis  Admission Condition: good  Discharged Condition: good  Indication for Admission: vaginal bleeding in pregnancy  Hospital Course: Patient evaluated for vaginal spotting, findings consistent with BV.   Consults: None  Significant Diagnostic Studies: labs: wetprep  Treatments: none  Discharge Exam: BP 119/71 (BP Location: Right Arm)   Pulse 82   Temp (!) 97.4 F (36.3 C) (Oral)   Resp 18   Ht 5\' 1"  (1.549 m)   Wt 83.9 kg   LMP 10/05/2017 (Exact Date)   SpO2 98%   BMI 34.96 kg/m   General Appearance:    Alert, cooperative, no distress, appears stated age  Head:    Normocephalic, without obvious abnormality, atraumatic  Eyes:    PERRL, conjunctiva/corneas clear, EOM's intact, fundi    benign, both eyes  Ears:    Normal TM's and external ear canals, both ears  Nose:   Nares normal, septum midline, mucosa normal, no drainage    or sinus tenderness  Throat:   Lips, mucosa, and tongue normal; teeth and gums normal  Neck:   Supple, symmetrical, trachea midline, no adenopathy;    thyroid:  no enlargement/tenderness/nodules; no carotid   bruit or JVD  Back:     Symmetric, no curvature, ROM normal, no CVA tenderness  Lungs:     Clear to auscultation bilaterally, respirations unlabored  Chest Wall:    No tenderness or deformity   Heart:    Regular rate and rhythm, S1 and S2 normal, no murmur, rub   or gallop  Breast Exam:    No tenderness, masses, or nipple abnormality  Abdomen:     Soft, non-tender, bowel sounds active all four quadrants,    no masses, no organomegaly  Genitalia:    Normal female without lesion,  discharge or tenderness  Rectal:    Normal tone, normal prostate, no masses or tenderness;   guaiac negative stool  Extremities:   Extremities normal, atraumatic, no cyanosis or edema  Pulses:   2+ and symmetric all extremities  Skin:   Skin color, texture, turgor normal, no rashes or lesions  Lymph nodes:   Cervical, supraclavicular, and axillary nodes normal  Neurologic:   CNII-XII intact, normal strength, sensation and reflexes    throughout    Disposition: Discharge disposition: 01-Home or Self Care       Patient Instructions:  Allergies as of 04/01/2018   No Known Allergies     Medication List    TAKE these medications   levothyroxine 125 MCG tablet Commonly known as:  SYNTHROID, LEVOTHROID Take 1 tablet (125 mcg total) by mouth daily before breakfast.   metroNIDAZOLE 0.75 % vaginal gel Commonly known as:  METROGEL Place 1 Applicatorful vaginally 2 (two) times daily.   prenatal multivitamin Tabs tablet Take 1 tablet by mouth daily at 12 noon.      Activity: activity as tolerated Diet: regular diet Wound Care: none needed  Follow-up with [facility] in 2 weeks.  Signed: Natale MilchChristanna R Searcy Miyoshi 04/01/2018 8:25 PM

## 2018-04-03 ENCOUNTER — Other Ambulatory Visit: Payer: Self-pay | Admitting: Obstetrics and Gynecology

## 2018-04-03 ENCOUNTER — Ambulatory Visit
Admission: RE | Admit: 2018-04-03 | Discharge: 2018-04-03 | Disposition: A | Discharge status: Home / Self Care | Payer: Medicaid Other | Source: Ambulatory Visit | Attending: Maternal & Fetal Medicine | Admitting: Maternal & Fetal Medicine

## 2018-04-03 DIAGNOSIS — O99282 Endocrine, nutritional and metabolic diseases complicating pregnancy, second trimester: Secondary | ICD-10-CM | POA: Diagnosis not present

## 2018-04-03 DIAGNOSIS — O99280 Endocrine, nutritional and metabolic diseases complicating pregnancy, unspecified trimester: Secondary | ICD-10-CM

## 2018-04-03 DIAGNOSIS — O26849 Uterine size-date discrepancy, unspecified trimester: Secondary | ICD-10-CM

## 2018-04-03 DIAGNOSIS — O26842 Uterine size-date discrepancy, second trimester: Secondary | ICD-10-CM | POA: Insufficient documentation

## 2018-04-03 DIAGNOSIS — O99212 Obesity complicating pregnancy, second trimester: Secondary | ICD-10-CM

## 2018-04-03 DIAGNOSIS — E039 Hypothyroidism, unspecified: Secondary | ICD-10-CM

## 2018-04-03 DIAGNOSIS — Z3A25 25 weeks gestation of pregnancy: Secondary | ICD-10-CM | POA: Insufficient documentation

## 2018-04-14 ENCOUNTER — Encounter: Payer: Self-pay | Admitting: Obstetrics and Gynecology

## 2018-04-14 ENCOUNTER — Other Ambulatory Visit: Payer: Medicaid Other

## 2018-04-14 ENCOUNTER — Ambulatory Visit (INDEPENDENT_AMBULATORY_CARE_PROVIDER_SITE_OTHER): Payer: Medicaid Other | Admitting: Obstetrics and Gynecology

## 2018-04-14 ENCOUNTER — Other Ambulatory Visit: Payer: Self-pay

## 2018-04-14 VITALS — BP 100/56 | Wt 188.0 lb

## 2018-04-14 DIAGNOSIS — O0990 Supervision of high risk pregnancy, unspecified, unspecified trimester: Secondary | ICD-10-CM

## 2018-04-14 DIAGNOSIS — O99280 Endocrine, nutritional and metabolic diseases complicating pregnancy, unspecified trimester: Secondary | ICD-10-CM

## 2018-04-14 DIAGNOSIS — O365990 Maternal care for other known or suspected poor fetal growth, unspecified trimester, not applicable or unspecified: Secondary | ICD-10-CM

## 2018-04-14 DIAGNOSIS — O26842 Uterine size-date discrepancy, second trimester: Secondary | ICD-10-CM

## 2018-04-14 DIAGNOSIS — Z3A27 27 weeks gestation of pregnancy: Secondary | ICD-10-CM

## 2018-04-14 DIAGNOSIS — Z131 Encounter for screening for diabetes mellitus: Secondary | ICD-10-CM

## 2018-04-14 DIAGNOSIS — O99213 Obesity complicating pregnancy, third trimester: Secondary | ICD-10-CM

## 2018-04-14 DIAGNOSIS — E039 Hypothyroidism, unspecified: Secondary | ICD-10-CM

## 2018-04-14 DIAGNOSIS — O34219 Maternal care for unspecified type scar from previous cesarean delivery: Secondary | ICD-10-CM

## 2018-04-14 DIAGNOSIS — O26849 Uterine size-date discrepancy, unspecified trimester: Secondary | ICD-10-CM

## 2018-04-14 DIAGNOSIS — Z113 Encounter for screening for infections with a predominantly sexual mode of transmission: Secondary | ICD-10-CM

## 2018-04-14 DIAGNOSIS — O365920 Maternal care for other known or suspected poor fetal growth, second trimester, not applicable or unspecified: Secondary | ICD-10-CM

## 2018-04-14 DIAGNOSIS — O99212 Obesity complicating pregnancy, second trimester: Secondary | ICD-10-CM

## 2018-04-14 DIAGNOSIS — O99282 Endocrine, nutritional and metabolic diseases complicating pregnancy, second trimester: Secondary | ICD-10-CM

## 2018-04-14 DIAGNOSIS — Z6833 Body mass index (BMI) 33.0-33.9, adult: Secondary | ICD-10-CM

## 2018-04-14 LAB — POCT URINALYSIS DIPSTICK OB
GLUCOSE, UA: NEGATIVE
POC,PROTEIN,UA: NEGATIVE

## 2018-04-14 NOTE — Progress Notes (Signed)
Routine Prenatal Care Visit  Subjective  Ashley Hunter is a 32 y.o. G2P1001 at 10867w2d being seen today for ongoing prenatal care.  She is currently monitored for the following issues for this high-risk pregnancy and has History of cesarean delivery, currently pregnant; Supervision of high risk pregnancy, antepartum; Obesity affecting pregnancy; BMI 33.0-33.9,adult; Fetal size inconsistent with dates; Hypothyroid in pregnancy, antepartum; Nausea and vomiting during pregnancy; minimal left fetal renal pelviectasis ; and Pregnancy affected by fetal growth restriction on their problem list.  ----------------------------------------------------------------------------------- Patient reports no complaints.   Contractions: Not present. Vag. Bleeding: None.  Movement: (!) Decreased. Denies leaking of fluid.  ----------------------------------------------------------------------------------- The following portions of the patient's history were reviewed and updated as appropriate: allergies, current medications, past family history, past medical history, past social history, past surgical history and problem list. Problem list updated.   Objective  Blood pressure (!) 100/56, weight 188 lb (85.3 kg), last menstrual period 10/05/2017, unknown if currently breastfeeding. Pregravid weight 179 lb (81.2 kg) Total Weight Gain 9 lb (4.082 kg) Urinalysis: Urine Protein    Urine Glucose    Fetal Status: Fetal Heart Rate (bpm): 130 Fundal Height: 26 cm Movement: (!) Decreased     General:  Alert, oriented and cooperative. Patient is in no acute distress.  Skin: Skin is warm and dry. No rash noted.   Cardiovascular: Normal heart rate noted  Respiratory: Normal respiratory effort, no problems with respiration noted  Abdomen: Soft, gravid, appropriate for gestational age. Pain/Pressure: Absent     Pelvic:  Cervical exam deferred        Extremities: Normal range of motion.  Edema: None  Mental Status: Normal  mood and affect. Normal behavior. Normal judgment and thought content.   Bedside u/s shows multiple fetal movements, nearly constant. Patient reassured.   Assessment   32 y.o. G2P1001 at 2767w2d by  07/12/2018, by Last Menstrual Period presenting for routine prenatal visit  Plan   pregnancy 2 Problems (from 10/05/17 to present)    Problem Noted Resolved   Pregnancy affected by fetal growth restriction 04/14/2018 by Conard NovakJackson, Helaina Stefano D, MD No   Fetal size inconsistent with dates 02/25/2018 by Conard NovakJackson, Swayze Pries D, MD No   Overview Addendum 03/13/2018 12:11 PM by Jimmey RalphLivingston, Elizabeth, MD    -Ultrasound on 02/25/2018 gives EDD of 07/24/2018.  Her EDD based on an early ultrasound is 07/12/2018.  Given a greater than 10-day discrepancy, [facility] and completion of her anatomy ultrasound. 03/13/18 - [facility] - EFW 10th percetnile by LMP , 24th by u/s dates - will f/u in 3 weeks       Hypothyroid in pregnancy, antepartum 02/25/2018 by Conard NovakJackson, Aaniyah Strohm D, MD No   Nausea and vomiting during pregnancy 02/25/2018 by Conard NovakJackson, Fumie Fiallo D, MD No   BMI 33.0-33.9,adult 12/06/2017 by Conard NovakJackson, Clover Feehan D, MD No   History of cesarean delivery, currently pregnant 11/21/2017 by Tresea MallGledhill, Jane, CNM No   Supervision of high risk pregnancy, antepartum 11/21/2017 by Tresea MallGledhill, Jane, CNM No   Overview Addendum 04/01/2018  7:22 PM by Natale MilchSchuman, Christanna R, MD    Clinic [facility] Prenatal Labs  Dating  LMP = 8wk US Blood type: B/Positive/-- (09/23 1536)   Genetic Screen  NIPS: normal XX Antibody:Negative (09/23 1536)  Anatomic US Incomplete heart views,  gorwth 10% at 22 wks Rubella: 19.30 (09/23 1536) Varicella: immune  GTT Early: 110              Third trimester:  RPR: Non Reactive (09/23 1536)   Rhogam  HBsAg: Negative (09/23 1536)   TDaP vaccine                       Flu Shot: HIV: Non Reactive (09/23 1536)   Baby Food                                GBS:   Contraception  Pap: [ ]    CBB      CS/VBAC Primary breech/oligo 2017   Support Person Husband Hemal         Obesity affecting pregnancy 11/21/2017 by Tresea MallGledhill, Jane, CNM No       Preterm labor symptoms and general obstetric precautions including but not limited to vaginal bleeding, contractions, leaking of fluid and fetal movement were reviewed in detail with the patient. Please refer to After Visit Summary for other counseling recommendations.   - 28 wk labs today. - encouraged patient to follow with [facility] for fetal growth and UA doppls. - Thyroid labs today  Return in about 2 weeks (around 04/28/2018) for Routine Prenatal Appointment.  Thomasene MohairStephen Vontae Court, MD, Merlinda FrederickFACOG [facility], [facility] 04/14/2018 3:49 PM

## 2018-04-15 LAB — 28 WEEK RH+PANEL
BASOS: 0 %
Basophils Absolute: 0 10*3/uL (ref 0.0–0.2)
EOS (ABSOLUTE): 0.1 10*3/uL (ref 0.0–0.4)
Eos: 2 %
Gestational Diabetes Screen: 148 mg/dL — ABNORMAL HIGH (ref 65–139)
HEMATOCRIT: 32.6 % — AB (ref 34.0–46.6)
HIV SCREEN 4TH GENERATION: NONREACTIVE
Hemoglobin: 11 g/dL — ABNORMAL LOW (ref 11.1–15.9)
IMMATURE GRANS (ABS): 0 10*3/uL (ref 0.0–0.1)
IMMATURE GRANULOCYTES: 0 %
LYMPHS: 22 %
Lymphocytes Absolute: 2 10*3/uL (ref 0.7–3.1)
MCH: 29.1 pg (ref 26.6–33.0)
MCHC: 33.7 g/dL (ref 31.5–35.7)
MCV: 86 fL (ref 79–97)
Monocytes Absolute: 0.6 10*3/uL (ref 0.1–0.9)
Monocytes: 6 %
NEUTROS ABS: 6.3 10*3/uL (ref 1.4–7.0)
NEUTROS PCT: 70 %
Platelets: 233 10*3/uL (ref 150–450)
RBC: 3.78 x10E6/uL (ref 3.77–5.28)
RDW: 12.5 % (ref 12.3–15.4)
RPR Ser Ql: NONREACTIVE
WBC: 9.1 10*3/uL (ref 3.4–10.8)

## 2018-04-15 LAB — TSH+FREE T4
FREE T4: 1.26 ng/dL (ref 0.82–1.77)
TSH: 0.47 u[IU]/mL (ref 0.450–4.500)

## 2018-04-17 ENCOUNTER — Other Ambulatory Visit: Payer: Self-pay | Admitting: Obstetrics and Gynecology

## 2018-04-17 DIAGNOSIS — Z6833 Body mass index (BMI) 33.0-33.9, adult: Secondary | ICD-10-CM

## 2018-04-17 DIAGNOSIS — O99213 Obesity complicating pregnancy, third trimester: Secondary | ICD-10-CM

## 2018-04-17 DIAGNOSIS — O99810 Abnormal glucose complicating pregnancy: Secondary | ICD-10-CM | POA: Insufficient documentation

## 2018-04-17 DIAGNOSIS — O0990 Supervision of high risk pregnancy, unspecified, unspecified trimester: Secondary | ICD-10-CM

## 2018-04-17 NOTE — Progress Notes (Signed)
Patient needs to be called to let her know that she did not pass her 1 hour glucose tolerance test.  She needs to schedule a fasting 3 hour gtt. I will put in the orders.

## 2018-04-18 ENCOUNTER — Telehealth: Payer: Self-pay

## 2018-04-18 NOTE — Telephone Encounter (Signed)
Pt aware, states she will call back soon and get this appt scheduled because she is not sure when she can come in

## 2018-04-18 NOTE — Telephone Encounter (Signed)
-----   Message from Conard NovakStephen D Jackson, MD sent at 04/17/2018 11:08 AM EST ----- Patient needs to be called to let her know that she did not pass her 1 hour glucose tolerance test.  She needs to schedule a fasting 3 hour gtt. I will put in the orders.

## 2018-04-21 ENCOUNTER — Other Ambulatory Visit: Payer: Self-pay

## 2018-04-21 DIAGNOSIS — O358XX0 Maternal care for other (suspected) fetal abnormality and damage, not applicable or unspecified: Secondary | ICD-10-CM

## 2018-04-21 DIAGNOSIS — O35EXX0 Maternal care for other (suspected) fetal abnormality and damage, fetal genitourinary anomalies, not applicable or unspecified: Secondary | ICD-10-CM

## 2018-04-28 ENCOUNTER — Other Ambulatory Visit: Payer: Self-pay | Admitting: Obstetrics and Gynecology

## 2018-04-28 ENCOUNTER — Ambulatory Visit
Admission: RE | Admit: 2018-04-28 | Discharge: 2018-04-28 | Disposition: A | Discharge status: Home / Self Care | Payer: Medicaid Other | Source: Ambulatory Visit | Attending: Obstetrics and Gynecology | Admitting: Obstetrics and Gynecology

## 2018-04-28 DIAGNOSIS — O35EXX0 Maternal care for other (suspected) fetal abnormality and damage, fetal genitourinary anomalies, not applicable or unspecified: Secondary | ICD-10-CM

## 2018-04-28 DIAGNOSIS — O365930 Maternal care for other known or suspected poor fetal growth, third trimester, not applicable or unspecified: Secondary | ICD-10-CM | POA: Insufficient documentation

## 2018-04-28 DIAGNOSIS — O358XX0 Maternal care for other (suspected) fetal abnormality and damage, not applicable or unspecified: Secondary | ICD-10-CM

## 2018-04-28 DIAGNOSIS — Z3A29 29 weeks gestation of pregnancy: Secondary | ICD-10-CM | POA: Insufficient documentation

## 2018-04-28 DIAGNOSIS — O365990 Maternal care for other known or suspected poor fetal growth, unspecified trimester, not applicable or unspecified: Secondary | ICD-10-CM | POA: Insufficient documentation

## 2018-04-29 ENCOUNTER — Ambulatory Visit (INDEPENDENT_AMBULATORY_CARE_PROVIDER_SITE_OTHER): Payer: Medicaid Other | Admitting: Obstetrics and Gynecology

## 2018-04-29 ENCOUNTER — Encounter: Payer: Self-pay | Admitting: Obstetrics and Gynecology

## 2018-04-29 VITALS — BP 104/62 | Wt 187.0 lb

## 2018-04-29 DIAGNOSIS — O365930 Maternal care for other known or suspected poor fetal growth, third trimester, not applicable or unspecified: Secondary | ICD-10-CM

## 2018-04-29 DIAGNOSIS — O0990 Supervision of high risk pregnancy, unspecified, unspecified trimester: Secondary | ICD-10-CM

## 2018-04-29 DIAGNOSIS — O365990 Maternal care for other known or suspected poor fetal growth, unspecified trimester, not applicable or unspecified: Secondary | ICD-10-CM

## 2018-04-29 DIAGNOSIS — Z3A29 29 weeks gestation of pregnancy: Secondary | ICD-10-CM

## 2018-04-29 DIAGNOSIS — O99213 Obesity complicating pregnancy, third trimester: Secondary | ICD-10-CM

## 2018-04-29 DIAGNOSIS — O35EXX0 Maternal care for other (suspected) fetal abnormality and damage, fetal genitourinary anomalies, not applicable or unspecified: Secondary | ICD-10-CM

## 2018-04-29 DIAGNOSIS — E039 Hypothyroidism, unspecified: Secondary | ICD-10-CM

## 2018-04-29 DIAGNOSIS — O99280 Endocrine, nutritional and metabolic diseases complicating pregnancy, unspecified trimester: Secondary | ICD-10-CM

## 2018-04-29 DIAGNOSIS — O358XX0 Maternal care for other (suspected) fetal abnormality and damage, not applicable or unspecified: Secondary | ICD-10-CM

## 2018-04-29 DIAGNOSIS — O34219 Maternal care for unspecified type scar from previous cesarean delivery: Secondary | ICD-10-CM

## 2018-04-29 DIAGNOSIS — O99283 Endocrine, nutritional and metabolic diseases complicating pregnancy, third trimester: Secondary | ICD-10-CM

## 2018-04-29 DIAGNOSIS — Z6833 Body mass index (BMI) 33.0-33.9, adult: Secondary | ICD-10-CM

## 2018-04-29 NOTE — Progress Notes (Signed)
Routine Prenatal Care Visit  Subjective  Ashley Hunter is a 33 y.o. G2P1001 at [redacted]w[redacted]d being seen today for ongoing prenatal care.  She is currently monitored for the following issues for this high-risk pregnancy and has History of cesarean delivery, currently pregnant; Supervision of high risk pregnancy, antepartum; Obesity affecting pregnancy; BMI 33.0-33.9,adult; Fetal size inconsistent with dates; Hypothyroid in pregnancy, antepartum; Nausea and vomiting during pregnancy; minimal left fetal renal pelviectasis ; Pregnancy affected by fetal growth restriction; Abnormal glucose tolerance in mother complicating pregnancy; and Antepartum fetal growth restriction on their problem list.  ----------------------------------------------------------------------------------- Patient reports no complaints.   Contractions: Not present. Vag. Bleeding: None.  Movement: Present. Denies leaking of fluid.  [facility] visit and ultrasound yesterday: Growth %ile < 3rd.  AFI normal. UA dopplers normal. ----------------------------------------------------------------------------------- The following portions of the patient's history were reviewed and updated as appropriate: allergies, current medications, past family history, past medical history, past social history, past surgical history and problem list. Problem list updated.  Objective  Blood pressure 104/62, weight 187 lb (84.8 kg), last menstrual period 10/05/2017, unknown if currently breastfeeding. Pregravid weight 179 lb (81.2 kg) Total Weight Gain 8 lb (3.629 kg) Urinalysis: Urine Protein    Urine Glucose    Fetal Status: Fetal Heart Rate (bpm): 130   Movement: Present     General:  Alert, oriented and cooperative. Patient is in no acute distress.  Skin: Skin is warm and dry. No rash noted.   Cardiovascular: Normal heart rate noted  Respiratory: Normal respiratory effort, no problems with respiration noted  Abdomen: Soft, gravid, appropriate for  gestational age. Pain/Pressure: Absent     Pelvic:  Cervical exam deferred        Extremities: Normal range of motion.  Edema: None  Mental Status: Normal mood and affect. Normal behavior. Normal judgment and thought content.   Assessment   33 y.o. G2P1001 at [redacted]w[redacted]d by  07/12/2018, by Last Menstrual Period presenting for routine prenatal visit  Plan   pregnancy 2 Problems (from 10/05/17 to present)    Problem Noted Resolved   Abnormal glucose tolerance in mother complicating pregnancy 04/17/2018 by Conard Novak, MD No   Pregnancy affected by fetal growth restriction 04/14/2018 by Conard Novak, MD No   Overview Addendum 04/30/2018 12:46 PM by Conard Novak, MD    - 10th %ile at 22 wks - 7th %ile at 26 weeks with normal UA dopplers - <3rd %ile at 29 weeks with normal UA dopplers - co-managed by [facility] - twice weekly NST, weekly AFI/UA Doppls - Growth q 3 weeks     Fetal size inconsistent with dates 02/25/2018 by Conard Novak, MD No   Overview Addendum 03/13/2018 12:11 PM by Jimmey Ralph, MD    -Ultrasound on 02/25/2018 gives EDD of 07/24/2018.  Her EDD based on an early ultrasound is 07/12/2018.  Given a greater than 10-day discrepancy, will refer to [facility] for further evaluation and completion of her anatomy ultrasound. 03/13/18 - [facility] - EFW 10th percetnile by LMP , 24th by u/s dates - will f/u in 3 weeks       Hypothyroid in pregnancy, antepartum 02/25/2018 by Conard Novak, MD No   Nausea and vomiting during pregnancy 02/25/2018 by Conard Novak, MD No   BMI 33.0-33.9,adult 12/06/2017 by Conard Novak, MD No   History of cesarean delivery, currently pregnant 11/21/2017 by Tresea Mall, CNM No   Supervision of high risk pregnancy, antepartum 11/21/2017 by  Tresea MallGledhill, Jane, CNM No   Overview Addendum 04/01/2018  7:22 PM by Natale MilchSchuman, Christanna R, MD    Clinic [facility] Prenatal Labs  Dating  LMP = 8wk US Blood type: B/Positive/-- (09/23 1536)    Genetic Screen  NIPS: normal XX Antibody:Negative (09/23 1536)  Anatomic US Incomplete heart views,  gorwth 10% at 22 wks Rubella: 19.30 (09/23 1536) Varicella: immune  GTT Early: 110              Third trimester:  RPR: Non Reactive (09/23 1536)   Rhogam  HBsAg: Negative (09/23 1536)   TDaP vaccine                       Flu Shot: HIV: Non Reactive (09/23 1536)   Baby Food                                GBS:   Contraception  Pap: [ ]    CBB     CS/VBAC Primary breech/oligo 2017   Support Person Husband Hemal         Obesity affecting pregnancy 11/21/2017 by Tresea MallGledhill, Jane, CNM No      Preterm labor symptoms and general obstetric precautions including but not limited to vaginal bleeding, contractions, leaking of fluid and fetal movement were reviewed in detail with the patient. Please refer to After Visit Summary for other counseling recommendations.   - 3h gtt ASAP. She prefers Monday due to child care issues.   - start twice weekly NSTs this week. Will have her return in 2 days for first NST.  Will have twice weekly visits. [facility] will perform weekly AFI/UA dopplers and Q 3 week growth ultrasound - long discussion regarding current findings and this may affect timing of delivery.  Discussed need for ongoing co-management with [facility], especially as regards delivery timing.  Return in about 2 days (around 05/01/2018) for Routine Prenatal Appointment/ NST (may schedule in MB if she desires), 3h gtt for 1/13 AM.  Thomasene MohairStephen Jaryn Hocutt, MD, Merlinda FrederickFACOG [facility], [facility] 04/30/2018 12:47 PM

## 2018-04-30 ENCOUNTER — Encounter: Payer: Self-pay | Admitting: Obstetrics and Gynecology

## 2018-05-01 ENCOUNTER — Other Ambulatory Visit: Payer: Self-pay

## 2018-05-01 ENCOUNTER — Other Ambulatory Visit: Payer: Self-pay | Admitting: Obstetrics and Gynecology

## 2018-05-01 DIAGNOSIS — O365990 Maternal care for other known or suspected poor fetal growth, unspecified trimester, not applicable or unspecified: Secondary | ICD-10-CM

## 2018-05-02 ENCOUNTER — Encounter: Payer: Self-pay | Admitting: Obstetrics and Gynecology

## 2018-05-02 ENCOUNTER — Ambulatory Visit (INDEPENDENT_AMBULATORY_CARE_PROVIDER_SITE_OTHER): Payer: Medicaid Other | Admitting: Obstetrics and Gynecology

## 2018-05-02 VITALS — BP 100/60 | Wt 188.0 lb

## 2018-05-02 DIAGNOSIS — O358XX0 Maternal care for other (suspected) fetal abnormality and damage, not applicable or unspecified: Secondary | ICD-10-CM

## 2018-05-02 DIAGNOSIS — O99280 Endocrine, nutritional and metabolic diseases complicating pregnancy, unspecified trimester: Secondary | ICD-10-CM

## 2018-05-02 DIAGNOSIS — O365990 Maternal care for other known or suspected poor fetal growth, unspecified trimester, not applicable or unspecified: Secondary | ICD-10-CM

## 2018-05-02 DIAGNOSIS — O365930 Maternal care for other known or suspected poor fetal growth, third trimester, not applicable or unspecified: Secondary | ICD-10-CM | POA: Diagnosis not present

## 2018-05-02 DIAGNOSIS — O0990 Supervision of high risk pregnancy, unspecified, unspecified trimester: Secondary | ICD-10-CM

## 2018-05-02 DIAGNOSIS — O99283 Endocrine, nutritional and metabolic diseases complicating pregnancy, third trimester: Secondary | ICD-10-CM

## 2018-05-02 DIAGNOSIS — E039 Hypothyroidism, unspecified: Secondary | ICD-10-CM

## 2018-05-02 DIAGNOSIS — O35EXX0 Maternal care for other (suspected) fetal abnormality and damage, fetal genitourinary anomalies, not applicable or unspecified: Secondary | ICD-10-CM

## 2018-05-02 DIAGNOSIS — O34219 Maternal care for unspecified type scar from previous cesarean delivery: Secondary | ICD-10-CM

## 2018-05-02 DIAGNOSIS — Z23 Encounter for immunization: Secondary | ICD-10-CM

## 2018-05-02 DIAGNOSIS — Z6833 Body mass index (BMI) 33.0-33.9, adult: Secondary | ICD-10-CM

## 2018-05-02 DIAGNOSIS — Z3A29 29 weeks gestation of pregnancy: Secondary | ICD-10-CM | POA: Diagnosis not present

## 2018-05-02 DIAGNOSIS — O0993 Supervision of high risk pregnancy, unspecified, third trimester: Secondary | ICD-10-CM

## 2018-05-02 DIAGNOSIS — O99213 Obesity complicating pregnancy, third trimester: Secondary | ICD-10-CM

## 2018-05-02 LAB — POCT URINALYSIS DIPSTICK OB
GLUCOSE, UA: NEGATIVE
PROTEIN: NEGATIVE

## 2018-05-02 LAB — FETAL NONSTRESS TEST

## 2018-05-02 MED ORDER — LEVOTHYROXINE SODIUM 125 MCG PO TABS: 125.0000 ug | ORAL_TABLET | Freq: Every day | ORAL | 5 refills | Status: AC

## 2018-05-02 MED ORDER — TETANUS-DIPHTH-ACELL PERTUSSIS 5-2.5-18.5 LF-MCG/0.5 IM SUSP
0.5000 mL | Freq: Once | INTRAMUSCULAR | Status: AC
  Administered 2018-05-02: 0.5 mL via INTRAMUSCULAR

## 2018-05-02 NOTE — Progress Notes (Signed)
Routine Prenatal Care Visit  Subjective  Ashley Hunter is a 33 y.o. G2P1001 at [redacted]w[redacted]d being seen today for ongoing prenatal care.  She is currently monitored for the following issues for this high-risk pregnancy and has History of cesarean delivery, currently pregnant; Supervision of high risk pregnancy, antepartum; Obesity affecting pregnancy; BMI 33.0-33.9,adult; Fetal size inconsistent with dates; Hypothyroid in pregnancy, antepartum; Nausea and vomiting during pregnancy; minimal left fetal renal pelviectasis ; Pregnancy affected by fetal growth restriction; Abnormal glucose tolerance in mother complicating pregnancy; and Antepartum fetal growth restriction on their problem list.  ----------------------------------------------------------------------------------- Patient reports no complaints.   Contractions: Not present. Vag. Bleeding: None.  Movement: Present. Denies leaking of fluid.  Presents today for schedule NST. Given severe fetal growth restriction, per [facility], she is to have twice weekly fetal antepartum testing. ----------------------------------------------------------------------------------- The following portions of the patient's history were reviewed and updated as appropriate: allergies, current medications, past family history, past medical history, past social history, past surgical history and problem list. Problem list updated.  Objective  Blood pressure 100/60, weight 188 lb (85.3 kg), last menstrual period 10/05/2017, unknown if currently breastfeeding. Pregravid weight 179 lb (81.2 kg) Total Weight Gain 9 lb (4.082 kg) Urinalysis: Urine Protein Negative  Urine Glucose Negative  Fetal Status: Fetal Heart Rate (bpm): 130   Movement: Present     General:  Alert, oriented and cooperative. Patient is in no acute distress.  Skin: Skin is warm and dry. No rash noted.   Cardiovascular: Normal heart rate noted  Respiratory: Normal respiratory effort, no problems with  respiration noted  Abdomen: Soft, gravid, appropriate for gestational age. Pain/Pressure: Absent     Pelvic:  Cervical exam deferred        Extremities: Normal range of motion.  Edema: None  Mental Status: Normal mood and affect. Normal behavior. Normal judgment and thought content.   NST Baseline FHR: 130 beats/min Variability: moderate Accelerations: present (10 x 10) Decelerations: absent Tocometry: not done  Interpretation:  INDICATIONS: fetal growth restricion RESULTS:  A NST procedure was performed with FHR monitoring and a normal baseline established, appropriate time of 20-40 minutes of evaluation, and accels >2 seen w 15x15 characteristics.  Results show a REACTIVE NST.    Assessment   32 y.o. G2P1001 at [redacted]w[redacted]d by  07/12/2018, by Last Menstrual Period presenting for routine prenatal visit  Plan   pregnancy 2 Problems (from 10/05/17 to present)    Problem Noted Resolved   Abnormal glucose tolerance in mother complicating pregnancy 04/17/2018 by Conard Novak, MD No   Pregnancy affected by fetal growth restriction 04/14/2018 by Conard Novak, MD No   Overview Addendum 04/30/2018 12:46 PM by Conard Novak, MD    - 10th %ile at 22 wks - 7th %ile at 26 weeks with normal UA dopplers - <3rd %ile at 29 weeks with normal UA dopplers - co-managed by [facility] - twice weekly NST, weekly AFI/UA Doppls - Growth q 3 weeks      Fetal size inconsistent with dates 02/25/2018 by Conard Novak, MD No   Overview Addendum 03/13/2018 12:11 PM by Jimmey Ralph, MD    -Ultrasound on 02/25/2018 gives EDD of 07/24/2018.  Her EDD based on an early ultrasound is 07/12/2018.  Given a greater than 10-day discrepancy, will refer to [facility] for further evaluation and completion of her anatomy ultrasound. 03/13/18 - [facility] - EFW 10th percetnile by LMP , 24th by u/s dates - will f/u in 3  weeks       Hypothyroid in pregnancy, antepartum 02/25/2018 by Conard NovakJackson, Stephen D, MD No    Nausea and vomiting during pregnancy 02/25/2018 by Conard NovakJackson, Stephen D, MD No   BMI 33.0-33.9,adult 12/06/2017 by Conard NovakJackson, Stephen D, MD No   History of cesarean delivery, currently pregnant 11/21/2017 by Tresea MallGledhill, Jane, CNM No   Supervision of high risk pregnancy, antepartum 11/21/2017 by Tresea MallGledhill, Jane, CNM No   Overview Addendum 04/01/2018  7:22 PM by Natale MilchSchuman, Christanna R, MD    [facility]inic [facility] Prenatal Labs  Dating  LMP = 8wk US Blood type: B/Positive/-- (09/23 1536)   Genetic Screen  NIPS: normal XX Antibody:Negative (09/23 1536)  Anatomic US Incomplete heart views,  gorwth 10% at 22 wks Rubella: 19.30 (09/23 1536) Varicella: immune  GTT Early: 110              Third trimester:  RPR: Non Reactive (09/23 1536)   Rhogam  HBsAg: Negative (09/23 1536)   TDaP vaccine                       Flu Shot: HIV: Non Reactive (09/23 1536)   Baby Food                                GBS:   Contraception  Pap: [ ]    CBB     CS/VBAC Primary breech/oligo 2017   Support Person Husband Hemal        Obesity affecting pregnancy 11/21/2017 by Tresea MallGledhill, Jane, CNM No      Preterm labor symptoms and general obstetric precautions including but not limited to vaginal bleeding, contractions, leaking of fluid and fetal movement were reviewed in detail with the patient. Please refer to After Visit Summary for other counseling recommendations.   - TDaP today - follow up Monday here for 3h gtt.  Follow up [facility] at 4pm. - refill on thyroid medication given. - NST reassuring today given gestational age.   Return in about 6 days (around 05/08/2018) for Routine prenatal with NST.  Thomasene MohairStephen Jackson, MD, Merlinda FrederickFACOG [facility], [facility] 05/02/2018 1:14 PM

## 2018-05-05 ENCOUNTER — Other Ambulatory Visit: Payer: Medicaid Other

## 2018-05-05 ENCOUNTER — Ambulatory Visit
Admission: RE | Admit: 2018-05-05 | Discharge: 2018-05-05 | Disposition: A | Discharge status: Home / Self Care | Payer: Medicaid Other | Source: Ambulatory Visit | Attending: Obstetrics and Gynecology | Admitting: Obstetrics and Gynecology

## 2018-05-05 DIAGNOSIS — Z6833 Body mass index (BMI) 33.0-33.9, adult: Secondary | ICD-10-CM

## 2018-05-05 DIAGNOSIS — O365931 Maternal care for other known or suspected poor fetal growth, third trimester, fetus 1: Secondary | ICD-10-CM | POA: Insufficient documentation

## 2018-05-05 DIAGNOSIS — O99213 Obesity complicating pregnancy, third trimester: Secondary | ICD-10-CM

## 2018-05-05 DIAGNOSIS — O99810 Abnormal glucose complicating pregnancy: Secondary | ICD-10-CM

## 2018-05-05 DIAGNOSIS — O0990 Supervision of high risk pregnancy, unspecified, unspecified trimester: Secondary | ICD-10-CM

## 2018-05-05 DIAGNOSIS — Z3A30 30 weeks gestation of pregnancy: Secondary | ICD-10-CM | POA: Diagnosis not present

## 2018-05-05 DIAGNOSIS — O365990 Maternal care for other known or suspected poor fetal growth, unspecified trimester, not applicable or unspecified: Secondary | ICD-10-CM

## 2018-05-06 ENCOUNTER — Encounter: Payer: Self-pay | Admitting: Obstetrics and Gynecology

## 2018-05-06 LAB — GESTATIONAL GLUCOSE TOLERANCE
GLUCOSE 1 HOUR GTT: 143 mg/dL (ref 65–179)
Glucose, Fasting: 82 mg/dL (ref 65–94)
Glucose, GTT - 2 Hour: 120 mg/dL (ref 65–154)
Glucose, GTT - 3 Hour: 76 mg/dL (ref 65–139)

## 2018-05-08 ENCOUNTER — Other Ambulatory Visit: Payer: Self-pay | Admitting: Obstetrics and Gynecology

## 2018-05-08 ENCOUNTER — Other Ambulatory Visit: Payer: Self-pay

## 2018-05-08 DIAGNOSIS — O365990 Maternal care for other known or suspected poor fetal growth, unspecified trimester, not applicable or unspecified: Secondary | ICD-10-CM

## 2018-05-09 ENCOUNTER — Encounter: Payer: Self-pay | Admitting: Obstetrics and Gynecology

## 2018-05-09 ENCOUNTER — Ambulatory Visit (INDEPENDENT_AMBULATORY_CARE_PROVIDER_SITE_OTHER): Payer: Medicaid Other | Admitting: Obstetrics and Gynecology

## 2018-05-09 VITALS — BP 110/60 | Wt 188.0 lb

## 2018-05-09 DIAGNOSIS — O0990 Supervision of high risk pregnancy, unspecified, unspecified trimester: Secondary | ICD-10-CM

## 2018-05-09 DIAGNOSIS — O365930 Maternal care for other known or suspected poor fetal growth, third trimester, not applicable or unspecified: Secondary | ICD-10-CM | POA: Diagnosis not present

## 2018-05-09 DIAGNOSIS — O365990 Maternal care for other known or suspected poor fetal growth, unspecified trimester, not applicable or unspecified: Secondary | ICD-10-CM

## 2018-05-09 DIAGNOSIS — O99283 Endocrine, nutritional and metabolic diseases complicating pregnancy, third trimester: Secondary | ICD-10-CM

## 2018-05-09 DIAGNOSIS — Z6833 Body mass index (BMI) 33.0-33.9, adult: Secondary | ICD-10-CM

## 2018-05-09 DIAGNOSIS — O99280 Endocrine, nutritional and metabolic diseases complicating pregnancy, unspecified trimester: Secondary | ICD-10-CM

## 2018-05-09 DIAGNOSIS — O34219 Maternal care for unspecified type scar from previous cesarean delivery: Secondary | ICD-10-CM

## 2018-05-09 DIAGNOSIS — O35EXX0 Maternal care for other (suspected) fetal abnormality and damage, fetal genitourinary anomalies, not applicable or unspecified: Secondary | ICD-10-CM

## 2018-05-09 DIAGNOSIS — O358XX0 Maternal care for other (suspected) fetal abnormality and damage, not applicable or unspecified: Secondary | ICD-10-CM | POA: Diagnosis not present

## 2018-05-09 DIAGNOSIS — E039 Hypothyroidism, unspecified: Secondary | ICD-10-CM

## 2018-05-09 DIAGNOSIS — Z3A30 30 weeks gestation of pregnancy: Secondary | ICD-10-CM

## 2018-05-09 DIAGNOSIS — O99213 Obesity complicating pregnancy, third trimester: Secondary | ICD-10-CM

## 2018-05-09 LAB — POCT URINALYSIS DIPSTICK OB
GLUCOSE, UA: NEGATIVE
POC,PROTEIN,UA: NEGATIVE

## 2018-05-09 NOTE — Progress Notes (Signed)
Routine Prenatal Care Visit  Subjective  Ashley Hunter is a 33 y.o. G2P1001 at [redacted]w[redacted]d being seen today for ongoing prenatal care.  She is currently monitored for the following issues for this high-risk pregnancy and has History of cesarean delivery, currently pregnant; Supervision of high risk pregnancy, antepartum; Obesity affecting pregnancy; BMI 33.0-33.9,adult; Fetal size inconsistent with dates; Hypothyroid in pregnancy, antepartum; Nausea and vomiting during pregnancy; minimal left fetal renal pelviectasis ; Pregnancy affected by fetal growth restriction; Abnormal glucose tolerance in mother complicating pregnancy; and Antepartum fetal growth restriction on their problem list.  ----------------------------------------------------------------------------------- Patient reports no complaints.   Contractions: Not present. Vag. Bleeding: None.  Movement: Present. Denies leaking of fluid.  ----------------------------------------------------------------------------------- The following portions of the patient's history were reviewed and updated as appropriate: allergies, current medications, past family history, past medical history, past social history, past surgical history and problem list. Problem list updated.   Objective  Blood pressure 110/60, weight 188 lb (85.3 kg), last menstrual period 10/05/2017, unknown if currently breastfeeding. Pregravid weight 179 lb (81.2 kg) Total Weight Gain 9 lb (4.082 kg) Urinalysis: Urine Protein Negative  Urine Glucose Negative  Fetal Status: Fetal Heart Rate (bpm): 125   Movement: Present     General:  Alert, oriented and cooperative. Patient is in no acute distress.  Skin: Skin is warm and dry. No rash noted.   Cardiovascular: Normal heart rate noted  Respiratory: Normal respiratory effort, no problems with respiration noted  Abdomen: Soft, gravid, appropriate for gestational age. Pain/Pressure: Absent     Pelvic:  Cervical exam deferred         Extremities: Normal range of motion.  Edema: None  Mental Status: Normal mood and affect. Normal behavior. Normal judgment and thought content.   NST: Baseline FHR: 125 beats/min Variability: moderate Accelerations: present Decelerations: absent Tocometry: not done  Interpretation:  INDICATIONS: Severe fetal growth restriction RESULTS:  A NST procedure was performed with FHR monitoring and a normal baseline established, appropriate time of 20-40 minutes of evaluation, and accels >2 seen w 15x15 characteristics.  Results show a REACTIVE NST.    Assessment   32 y.o. G2P1001 at [redacted]w[redacted]d by  07/12/2018, by Last Menstrual Period presenting for routine prenatal visit  Plan   pregnancy 2 Problems (from 10/05/17 to present)    Problem Noted Resolved   Abnormal glucose tolerance in mother complicating pregnancy 04/17/2018 by Conard Novak, MD No   Pregnancy affected by fetal growth restriction 04/14/2018 by Conard Novak, MD No   Overview Addendum 04/30/2018 12:46 PM by Conard Novak, MD    - 10th %ile at 22 wks - 7th %ile at 26 weeks with normal UA dopplers - <3rd %ile at 29 weeks with normal UA dopplers - co-managed by [facility] - twice weekly NST, weekly AFI/UA Doppls - Growth q 3 weeks       Fetal size inconsistent with dates 02/25/2018 by Conard Novak, MD No   Overview Addendum 03/13/2018 12:11 PM by Jimmey Ralph, MD    -Ultrasound on 02/25/2018 gives EDD of 07/24/2018.  Her EDD based on an early ultrasound is 07/12/2018.  Given a greater than 10-day discrepancy, will refer to [facility] for further evaluation and completion of her anatomy ultrasound. 03/13/18 - [facility] - EFW 10th percetnile by LMP , 24th by u/s dates - will f/u in 3 weeks       Hypothyroid in pregnancy, antepartum 02/25/2018 by Conard Novak, MD No   Nausea and  vomiting during pregnancy 02/25/2018 by Conard Novak, MD No   BMI 33.0-33.9,adult 12/06/2017 by Conard Novak, MD No    History of cesarean delivery, currently pregnant 11/21/2017 by Tresea Mall, CNM No   Supervision of high risk pregnancy, antepartum 11/21/2017 by Tresea Mall, CNM No   Overview Addendum 04/01/2018  7:22 PM by Natale Milch, MD    [facility]inic [facility] Prenatal Labs  Dating  LMP = 8wk [place] Blood type: B/Positive/-- (09/23 1536)   Genetic Screen  NIPS: normal XX Antibody:Negative (09/23 1536)  Anatomic [place] Incomplete heart views,  gorwth 10% at 22 wks Rubella: 19.30 (09/23 1536) Varicella: immune  GTT Early: 110              Third trimester:  RPR: Non Reactive (09/23 1536)   Rhogam  HBsAg: Negative (09/23 1536)   TDaP vaccine                       Flu Shot: HIV: Non Reactive (09/23 1536)   Baby Food                                GBS:   Contraception  Pap: [ ]    CBB     CS/VBAC Primary breech/oligo 2017   Support Person Husband Hemal           Obesity affecting pregnancy 11/21/2017 by Tresea Mall, CNM No       Preterm labor symptoms and general obstetric precautions including but not limited to vaginal bleeding, contractions, leaking of fluid and fetal movement were reviewed in detail with the patient. Please refer to After Visit Summary for other counseling recommendations.   Return in about 1 week (around 05/16/2018) for weekly NSTs with Dr. Jean Rosenthal (multiple weeks).  Thomasene Mohair, MD, [facility] Frederick [facility], [facility] 05/09/2018 4:20 PM

## 2018-05-15 ENCOUNTER — Ambulatory Visit
Admission: RE | Admit: 2018-05-15 | Discharge: 2018-05-15 | Disposition: A | Discharge status: Home / Self Care | Payer: Medicaid Other | Source: Ambulatory Visit | Attending: Maternal & Fetal Medicine | Admitting: Maternal & Fetal Medicine

## 2018-05-15 ENCOUNTER — Ambulatory Visit (INDEPENDENT_AMBULATORY_CARE_PROVIDER_SITE_OTHER): Payer: Medicaid Other | Admitting: Obstetrics and Gynecology

## 2018-05-15 ENCOUNTER — Encounter: Payer: Self-pay | Admitting: Obstetrics and Gynecology

## 2018-05-15 ENCOUNTER — Other Ambulatory Visit: Payer: Medicaid Other

## 2018-05-15 ENCOUNTER — Other Ambulatory Visit: Payer: Self-pay

## 2018-05-15 VITALS — BP 118/74 | Wt 186.0 lb

## 2018-05-15 DIAGNOSIS — O365930 Maternal care for other known or suspected poor fetal growth, third trimester, not applicable or unspecified: Secondary | ICD-10-CM | POA: Insufficient documentation

## 2018-05-15 DIAGNOSIS — O0990 Supervision of high risk pregnancy, unspecified, unspecified trimester: Secondary | ICD-10-CM

## 2018-05-15 DIAGNOSIS — O365990 Maternal care for other known or suspected poor fetal growth, unspecified trimester, not applicable or unspecified: Secondary | ICD-10-CM

## 2018-05-15 DIAGNOSIS — Z6833 Body mass index (BMI) 33.0-33.9, adult: Secondary | ICD-10-CM

## 2018-05-15 DIAGNOSIS — O34219 Maternal care for unspecified type scar from previous cesarean delivery: Secondary | ICD-10-CM | POA: Diagnosis not present

## 2018-05-15 DIAGNOSIS — O358XX0 Maternal care for other (suspected) fetal abnormality and damage, not applicable or unspecified: Secondary | ICD-10-CM

## 2018-05-15 DIAGNOSIS — O99213 Obesity complicating pregnancy, third trimester: Secondary | ICD-10-CM

## 2018-05-15 DIAGNOSIS — O99283 Endocrine, nutritional and metabolic diseases complicating pregnancy, third trimester: Secondary | ICD-10-CM

## 2018-05-15 DIAGNOSIS — O0993 Supervision of high risk pregnancy, unspecified, third trimester: Secondary | ICD-10-CM

## 2018-05-15 DIAGNOSIS — Z3A31 31 weeks gestation of pregnancy: Secondary | ICD-10-CM | POA: Diagnosis not present

## 2018-05-15 DIAGNOSIS — O35EXX0 Maternal care for other (suspected) fetal abnormality and damage, fetal genitourinary anomalies, not applicable or unspecified: Secondary | ICD-10-CM

## 2018-05-15 DIAGNOSIS — O99280 Endocrine, nutritional and metabolic diseases complicating pregnancy, unspecified trimester: Secondary | ICD-10-CM

## 2018-05-15 DIAGNOSIS — E039 Hypothyroidism, unspecified: Secondary | ICD-10-CM

## 2018-05-15 NOTE — Progress Notes (Signed)
Routine Prenatal Care Visit  Subjective  Ashley Hunter is a 33 y.o. G2P1001 at [redacted]w[redacted]d being seen today for ongoing prenatal care.  She is currently monitored for the following issues for this high-risk pregnancy and has History of cesarean delivery, currently pregnant; Supervision of high risk pregnancy, antepartum; Obesity affecting pregnancy; BMI 33.0-33.9,adult; Fetal size inconsistent with dates; Hypothyroid in pregnancy, antepartum; Nausea and vomiting during pregnancy; minimal left fetal renal pelviectasis ; Pregnancy affected by fetal growth restriction; Abnormal glucose tolerance in mother complicating pregnancy; and Antepartum fetal growth restriction on their problem list.  ----------------------------------------------------------------------------------- Patient reports no complaints.   Contractions: Not present. Vag. Bleeding: None.  Movement: Present. Denies leaking of fluid.  ----------------------------------------------------------------------------------- The following portions of the patient's history were reviewed and updated as appropriate: allergies, current medications, past family history, past medical history, past social history, past surgical history and problem list. Problem list updated.   Objective  Blood pressure 118/74, weight 186 lb (84.4 kg), last menstrual period 10/05/2017, unknown if currently breastfeeding. Pregravid weight 179 lb (81.2 kg) Total Weight Gain 7 lb (3.175 kg) Urinalysis: Urine Protein    Urine Glucose    Fetal Status: Fetal Heart Rate (bpm): 125   Movement: Present    denies vaginal bleeding and leakage of fluid  General:  Alert, oriented and cooperative. Patient is in no acute distress.  Skin: Skin is warm and dry. No rash noted.   Cardiovascular: Normal heart rate noted  Respiratory: Normal respiratory effort, no problems with respiration noted  Abdomen: Soft, gravid, appropriate for gestational age. Pain/Pressure: Absent     Pelvic:   Cervical exam deferred        Extremities: Normal range of motion.  Edema: None  Mental Status: Normal mood and affect. Normal behavior. Normal judgment and thought content.   NST: Baseline FHR: 125 beats/min Variability: moderate Accelerations: present Decelerations: absent Tocometry: not done  Interpretation:  INDICATIONS: severe fetal growth restriction RESULTS:  A NST procedure was performed with FHR monitoring and a normal baseline established, appropriate time of 20-40 minutes of evaluation, and accels >2 seen w 15x15 characteristics.  Results show a REACTIVE NST.    Assessment   32 y.o. G2P1001 at [redacted]w[redacted]d by  07/12/2018, by Last Menstrual Period presenting for routine prenatal visit  Plan   pregnancy 2 Problems (from 10/05/17 to present)    Problem Noted Resolved   Abnormal glucose tolerance in mother complicating pregnancy 04/17/2018 by Conard Novak, MD No   Pregnancy affected by fetal growth restriction 04/14/2018 by Conard Novak, MD No   Overview Addendum 04/30/2018 12:46 PM by Conard Novak, MD    - 10th %ile at 22 wks - 7th %ile at 26 weeks with normal UA dopplers - <3rd %ile at 29 weeks with normal UA dopplers - co-managed by [facility] - twice weekly NST, weekly AFI/UA Doppls - Growth q 3 weeks       Fetal size inconsistent with dates 02/25/2018 by Conard Novak, MD No   Overview Addendum 03/13/2018 12:11 PM by Jimmey Ralph, MD    -Ultrasound on 02/25/2018 gives EDD of 07/24/2018.  Her EDD based on an early ultrasound is 07/12/2018.  Given a greater than 10-day discrepancy, will refer to [facility] MFM for further evaluation and completion of her anatomy ultrasound. 03/13/18 - [facility] - EFW 10th percetnile by LMP , 24th by u/s dates - will f/u in 3 weeks       Hypothyroid in pregnancy, antepartum 02/25/2018 by  Conard NovakJackson, Shadrack Brummitt D, MD No   Nausea and vomiting during pregnancy 02/25/2018 by Conard NovakJackson, Lindsy Cerullo D, MD No   BMI 33.0-33.9,adult 12/06/2017 by  Conard NovakJackson, Olusegun Gerstenberger D, MD No   History of cesarean delivery, currently pregnant 11/21/2017 by Tresea MallGledhill, Jane, CNM No   Supervision of high risk pregnancy, antepartum 11/21/2017 by Tresea MallGledhill, Jane, CNM No   Overview Addendum 04/01/2018  7:22 PM by Natale MilchSchuman, Christanna R, MD    [facility] Prenatal Labs  Dating  LMP = 8wk US Blood type: B/Positive/-- (09/23 1536)   Genetic Screen  NIPS: normal XX Antibody:Negative (09/23 1536)  Anatomic US Incomplete heart views,  gorwth 10% at 22 wks Rubella: 19.30 (09/23 1536) Varicella: immune  GTT Early: 110              Third trimester:  RPR: Non Reactive (09/23 1536)   Rhogam  HBsAg: Negative (09/23 1536)   TDaP vaccine                       Flu Shot: HIV: Non Reactive (09/23 1536)   Baby Food                                GBS:   Contraception  Pap: [ ]    CBB     CS/VBAC Primary breech/oligo 2017   Support Person Husband Hemal        Obesity affecting pregnancy 11/21/2017 by Tresea MallGledhill, Jane, CNM No       Preterm labor symptoms and general obstetric precautions including but not limited to vaginal bleeding, contractions, leaking of fluid and fetal movement were reviewed in detail with the patient. Please refer to After Visit Summary for other counseling recommendations.   - reactive NST today - [facility] - continue twice weekly APT, including AFI with UA dopplers.  Growth Q 3 weeks per [facility] - Given severity of FGR, I have taken the liberty of scheduling a repeat c-section at 37 weeks, assuming the patient will not have an indication for earlier delivery (oligohydramnios, AEDF/REDF, etc, abnormal APT, etc). - consider BMTZ, if delivery seems sooner and imminent (within 7 days of delivery) - signed 30-day paperwork for BTL today.  Return in about 4 days (around 05/19/2018) for Keep previously scheduled ROB/NST appt.  Thomasene MohairStephen Tayli Buch, MD, Merlinda FrederickFACOG [facility], [facility] 05/15/2018 10:48 AM

## 2018-05-16 ENCOUNTER — Telehealth: Payer: Self-pay | Admitting: Obstetrics and Gynecology

## 2018-05-16 NOTE — Telephone Encounter (Signed)
Patient is scheduled for H&P at [facility] on 06/24/18 @ 2:50pm w/ Dr Jean Rosenthal, Pre-admit Testing to be scheduled for 06/25/18 (10:00am requested), and OR on 06/26/18.

## 2018-05-16 NOTE — Telephone Encounter (Signed)
-----   Message from Conard Novak, MD sent at 05/15/2018 10:44 AM EST ----- Regarding: Schedule surgery Surgery Booking Request Patient Full Name:  Ashley Hunter  MRN: [id number]  DOB: 08/21/1985  Surgeon: Thomasene Mohair, MD  Requested Surgery Date and Time: 06/26/2018 Primary Diagnosis AND Code: Fetal growth restriction, history of cesarean delivery, desires repeat, desires permanent sterilization Secondary Diagnosis and Code:  Surgical Procedure: Cesarean section with tubal ligation L&D Notification: Yes Admission Status: surgery admit Length of Surgery: 60 min Special Case Needs: none H&P: TBD (date) Phone Interview???: no Interpreter: Language:  Medical Clearance: no Special Scheduling Instructions: none

## 2018-05-17 ENCOUNTER — Encounter: Payer: Self-pay | Admitting: Emergency Medicine

## 2018-05-17 ENCOUNTER — Other Ambulatory Visit: Payer: Self-pay

## 2018-05-17 ENCOUNTER — Emergency Department
Admission: EM | Admit: 2018-05-17 | Discharge: 2018-05-17 | Disposition: A | Discharge status: Home / Self Care | Payer: Medicaid Other | Attending: Emergency Medicine | Admitting: Emergency Medicine

## 2018-05-17 DIAGNOSIS — E039 Hypothyroidism, unspecified: Secondary | ICD-10-CM | POA: Insufficient documentation

## 2018-05-17 DIAGNOSIS — H9201 Otalgia, right ear: Secondary | ICD-10-CM | POA: Diagnosis present

## 2018-05-17 DIAGNOSIS — Z79899 Other long term (current) drug therapy: Secondary | ICD-10-CM | POA: Insufficient documentation

## 2018-05-17 MED ORDER — CIPROFLOXACIN-DEXAMETHASONE 0.3-0.1 % OT SUSP
4.0000 [drp] | Freq: Two times a day (BID) | OTIC | Status: DC
  Administered 2018-05-17: 4 [drp] via OTIC
  Filled 2018-05-17: qty 7.5

## 2018-05-17 MED ORDER — ACETAMINOPHEN 325 MG PO TABS
650.0000 mg | ORAL_TABLET | Freq: Once | ORAL | Status: AC
  Administered 2018-05-17: 650 mg via ORAL
  Filled 2018-05-17: qty 2

## 2018-05-17 MED ORDER — AMOXICILLIN 500 MG PO CAPS: 500.0000 mg | ORAL_CAPSULE | Freq: Three times a day (TID) | ORAL | 0 refills | Status: DC

## 2018-05-17 MED ORDER — AMOXICILLIN 500 MG PO CAPS
500.0000 mg | ORAL_CAPSULE | Freq: Once | ORAL | Status: AC
  Administered 2018-05-17: 500 mg via ORAL
  Filled 2018-05-17: qty 1

## 2018-05-17 MED ORDER — NEOMYCIN-COLIST-HC-THONZONIUM 3.3-3-10-0.5 MG/ML OT SUSP: 4.0000 [drp] | Freq: Four times a day (QID) | OTIC | 0 refills | Status: DC

## 2018-05-17 NOTE — Discharge Instructions (Signed)
Follow discharge care instruction, use eardrops as directed, and take antibiotics as directed.  Advised extra strength Tylenol for pain.

## 2018-05-17 NOTE — ED Triage Notes (Signed)
R earache since this am.

## 2018-05-17 NOTE — ED Provider Notes (Signed)
[facility] Emergency Department Provider Note   ____________________________________________   First MD Initiated Contact with Patient 05/17/18 1355     (approximate)  I have reviewed the triage vital signs and the nursing notes.   HISTORY  Chief Complaint Otalgia    HPI Ashley Hunter is a 33 y.o. female patient awakened with right earache this morning.  Patient denies hearing loss or vertigo.  Patient stated pain radiates to her upper jaw.  Patient denies fever associated this complaint.  Patient described the pain as "achy".  No palliative measure for complaint.  Patient denies abdominal or pelvic pain.  Patient is [redacted] weeks gestation. Past Medical History:  Diagnosis Date  . Hypothyroidism     Patient Active Problem List   Diagnosis Date Noted  . Antepartum fetal growth restriction 04/28/2018  . Abnormal glucose tolerance in mother complicating pregnancy 04/17/2018  . Pregnancy affected by fetal growth restriction 04/14/2018  . minimal left fetal renal pelviectasis  03/13/2018  . Fetal size inconsistent with dates 02/25/2018  . Hypothyroid in pregnancy, antepartum 02/25/2018  . Nausea and vomiting during pregnancy 02/25/2018  . BMI 33.0-33.9,adult 12/06/2017  . History of cesarean delivery, currently pregnant 11/21/2017  . Supervision of high risk pregnancy, antepartum 11/21/2017  . Obesity affecting pregnancy 11/21/2017    Past Surgical History:  Procedure Laterality Date  . APPENDECTOMY    . CESAREAN SECTION N/A 04/27/2016   Procedure: CESAREAN SECTION FOR BREECH PRESENTATION;  Surgeon: Conard NovakStephen D Jackson, MD;  Location: [facility] ORS;  Service: Obstetrics;  Laterality: N/A;  Baby girl born @ 1300 Apgars: 8/9 Weight: 5lbs 9ozs    Prior to Admission medications   Medication Sig Start Date End Date Taking? Authorizing Provider  amoxicillin (AMOXIL) 500 MG capsule Take 1 capsule (500 mg total) by mouth 3 (three) times daily. 05/17/18   Joni ReiningSmith,   K, PA-C  levothyroxine (SYNTHROID, LEVOTHROID) 125 MCG tablet Take 1 tablet (125 mcg total) by mouth daily before breakfast. 05/02/18   Conard NovakJackson, Stephen D, MD  neomycin-colistin-hydrocortisone-thonzonium (COLY-MYCIN S) 3.06-23-08-0.5 MG/ML OTIC suspension Place 4 drops into the right ear 4 (four) times daily. 05/17/18   Joni ReiningSmith,  K, PA-C  Prenat-FeFum-FePo-FA-Omega 3 (CONCEPT DHA) 53.5-38-1 MG CAPS Take 1 capsule by mouth daily. 04/12/18   [provider]    Allergies Patient has no known allergies.  Family History  Problem Relation Age of Onset  . Diabetes Paternal Grandfather     Social History Social History   Tobacco Use  . Smoking status: Never Smoker  . Smokeless tobacco: Never Used  Substance Use Topics  . Alcohol use: No  . Drug use: No    Review of Systems Constitutional: No fever/chills Eyes: No visual changes. ENT: No sore throat.  Right ear pain. Cardiovascular: Denies chest pain. Respiratory: Denies shortness of breath. Gastrointestinal: No abdominal pain.  No nausea, no vomiting.  No diarrhea.  No constipation. Genitourinary: Negative for dysuria. Musculoskeletal: Negative for back pain. Skin: Negative for rash. Neurological: Negative for headaches, focal weakness or numbness.   ____________________________________________   PHYSICAL EXAM:  VITAL SIGNS: ED Triage Vitals [05/17/18 1351]  Enc Vitals Group     BP 103/66     Pulse Rate (!) 104     Resp 18     Temp 98 F (36.7 C)     Temp Source Oral     SpO2 99 %     Weight 186 lb (84.4 kg)     Height 5\' 1"  (1.549  m)     Head Circumference      Peak Flow      Pain Score 10     Pain Loc      Pain Edu?      Excl. in GC?     Constitutional: Alert and oriented. Well appearing and in no acute distress. Eyes: Conjunctivae are normal. PERRL. EOMI. Head: Atraumatic. Nose: No congestion/rhinnorhea. EARS: Mild edema right ear canal.  Erythematous right TM. Mouth/Throat: Mucous  membranes are moist.  Oropharynx non-erythematous. Neck: No stridor.   Hematological/Lymphatic/Immunilogical: No cervical lymphadenopathy. Cardiovascular: Normal rate, regular rhythm. Grossly normal heart sounds.  Good peripheral circulation. Respiratory: Normal respiratory effort.  No retractions. Lungs CTAB. Gastrointestinal: Soft and nontender.  Distention secondary to gas station.  No abdominal bruits. No CVA tenderness. Musculoskeletal: No lower extremity tenderness nor edema.  No joint effusions. Neurologic:  Normal speech and language. No gross focal neurologic deficits are appreciated. No gait instability. Skin:  Skin is warm, dry and intact. No rash noted. Psychiatric: Mood and affect are normal. Speech and behavior are normal.  ____________________________________________   LABS (all labs ordered are listed, but only abnormal results are displayed)  Labs Reviewed - No data to display ____________________________________________  EKG   ____________________________________________  RADIOLOGY  ED MD interpretation:    Official radiology report(s): No results found.  ____________________________________________   PROCEDURES  Procedure(s) performed: None  Procedures  Critical Care performed: No  ____________________________________________   INITIAL IMPRESSION / ASSESSMENT AND PLAN / ED COURSE  As part of my medical decision making, I reviewed the following data within the electronic MEDICAL RECORD NUMBER     Otalgia right ear.  Patient given discharge care instruction advised take medication as directed.  Patient advised follow-up PCP if no improvement in 2 days.  Return to ED if condition worsens.      ____________________________________________   FINAL CLINICAL IMPRESSION(S) / ED DIAGNOSES  Final diagnoses:  Otalgia of right ear     ED Discharge Orders         Ordered    neomycin-colistin-hydrocortisone-thonzonium (COLY-MYCIN S) 3.06-23-08-0.5  MG/ML OTIC suspension  4 times daily     05/17/18 1414    amoxicillin (AMOXIL) 500 MG capsule  3 times daily     05/17/18 1416           Note:  This document was prepared using Dragon voice recognition software and may include unintentional dictation errors.    Joni Reining, PA-C 05/17/18 1427    Jene Every, MD 05/17/18 [phone]

## 2018-05-19 ENCOUNTER — Other Ambulatory Visit: Payer: Self-pay

## 2018-05-19 ENCOUNTER — Ambulatory Visit
Admission: RE | Admit: 2018-05-19 | Discharge: 2018-05-19 | Disposition: A | Discharge status: Home / Self Care | Payer: Medicaid Other | Source: Ambulatory Visit | Attending: Maternal & Fetal Medicine | Admitting: Maternal & Fetal Medicine

## 2018-05-19 DIAGNOSIS — O365990 Maternal care for other known or suspected poor fetal growth, unspecified trimester, not applicable or unspecified: Secondary | ICD-10-CM

## 2018-05-19 DIAGNOSIS — Z3A32 32 weeks gestation of pregnancy: Secondary | ICD-10-CM | POA: Insufficient documentation

## 2018-05-22 ENCOUNTER — Other Ambulatory Visit: Payer: Medicaid Other

## 2018-05-22 ENCOUNTER — Other Ambulatory Visit: Payer: Self-pay

## 2018-05-23 ENCOUNTER — Ambulatory Visit (INDEPENDENT_AMBULATORY_CARE_PROVIDER_SITE_OTHER): Payer: Medicaid Other | Admitting: Obstetrics & Gynecology

## 2018-05-23 VITALS — BP 100/60 | Wt 187.0 lb

## 2018-05-23 DIAGNOSIS — O99213 Obesity complicating pregnancy, third trimester: Secondary | ICD-10-CM

## 2018-05-23 DIAGNOSIS — O365990 Maternal care for other known or suspected poor fetal growth, unspecified trimester, not applicable or unspecified: Secondary | ICD-10-CM

## 2018-05-23 DIAGNOSIS — O365930 Maternal care for other known or suspected poor fetal growth, third trimester, not applicable or unspecified: Secondary | ICD-10-CM | POA: Diagnosis not present

## 2018-05-23 DIAGNOSIS — Z3A32 32 weeks gestation of pregnancy: Secondary | ICD-10-CM

## 2018-05-23 DIAGNOSIS — O34219 Maternal care for unspecified type scar from previous cesarean delivery: Secondary | ICD-10-CM

## 2018-05-23 DIAGNOSIS — O0990 Supervision of high risk pregnancy, unspecified, unspecified trimester: Secondary | ICD-10-CM

## 2018-05-23 DIAGNOSIS — H6690 Otitis media, unspecified, unspecified ear: Secondary | ICD-10-CM

## 2018-05-23 LAB — POCT URINALYSIS DIPSTICK OB
Glucose, UA: NEGATIVE
POC,PROTEIN,UA: NEGATIVE

## 2018-05-23 LAB — FETAL NONSTRESS TEST

## 2018-05-23 MED ORDER — NEOMYCIN-COLIST-HC-THONZONIUM 3.3-3-10-0.5 MG/ML OT SUSP: 4.0000 [drp] | Freq: Four times a day (QID) | OTIC | 0 refills | Status: DC

## 2018-05-23 MED ORDER — CEFIXIME 400 MG PO CAPS: 400.0000 mg | ORAL_CAPSULE | Freq: Every day | ORAL | 0 refills | Status: DC

## 2018-05-23 NOTE — Progress Notes (Signed)
  Subjective  Fetal Movement? yes Contractions? no Leaking Fluid? no Vaginal Bleeding? no  Objective  BP 100/60   Wt 187 lb (84.8 kg)   LMP 10/05/2017 (Exact Date)   BMI 35.33 kg/m  General: NAD Pumonary: no increased work of breathing Abdomen: gravid, non-tender Extremities: no edema Psychiatric: mood appropriate, affect full  Assessment  32 y.o. G2P1001 at 6763w6d by  07/12/2018, by Last Menstrual Period presenting for routine prenatal visit  Plan   Problem List Items Addressed This Visit    History of cesarean delivery, currently pregnant   Supervision of high risk pregnancy, antepartum   Obesity affecting pregnancy   Antepartum fetal growth restriction    [facility]    Weekly NST here   [redacted] weeks gestation of pregnancy    -  Primary   Ear infection       Relevant Medications   cefixime (SUPRAX) 400 MG CAPS capsule Also, get ear drop Rx and take daily    A NST procedure was performed with FHR monitoring and a normal baseline established, appropriate time of 20-40 minutes of evaluation, and accels >2 seen w 15x15 characteristics.  Results show a REACTIVE NST.   Annamarie MajorPaul Tiera Mensinger, MD, Merlinda FrederickFACOG [facility], [facility] 05/23/2018  11:17 AM

## 2018-05-23 NOTE — Patient Instructions (Signed)
Ear drops and new antibiotic called in to [facility]

## 2018-05-23 NOTE — Addendum Note (Signed)
Addended by: Kathlene Cote on: 05/23/2018 11:41 AM   Modules accepted: Orders

## 2018-05-26 ENCOUNTER — Ambulatory Visit
Admission: RE | Admit: 2018-05-26 | Discharge: 2018-05-26 | Disposition: A | Discharge status: Home / Self Care | Payer: Medicaid Other | Source: Ambulatory Visit | Attending: Obstetrics and Gynecology | Admitting: Obstetrics and Gynecology

## 2018-05-26 ENCOUNTER — Other Ambulatory Visit: Payer: Self-pay | Admitting: Obstetrics and Gynecology

## 2018-05-26 DIAGNOSIS — O365990 Maternal care for other known or suspected poor fetal growth, unspecified trimester, not applicable or unspecified: Secondary | ICD-10-CM

## 2018-05-26 DIAGNOSIS — O365930 Maternal care for other known or suspected poor fetal growth, third trimester, not applicable or unspecified: Secondary | ICD-10-CM | POA: Diagnosis not present

## 2018-05-26 DIAGNOSIS — Z3A33 33 weeks gestation of pregnancy: Secondary | ICD-10-CM | POA: Diagnosis not present

## 2018-05-27 ENCOUNTER — Encounter: Payer: Self-pay | Admitting: Emergency Medicine

## 2018-05-27 ENCOUNTER — Other Ambulatory Visit: Payer: Self-pay

## 2018-05-27 ENCOUNTER — Emergency Department
Admission: EM | Admit: 2018-05-27 | Discharge: 2018-05-27 | Disposition: A | Discharge status: Home / Self Care | Payer: Medicaid Other | Attending: Emergency Medicine | Admitting: Emergency Medicine

## 2018-05-27 DIAGNOSIS — J111 Influenza due to unidentified influenza virus with other respiratory manifestations: Secondary | ICD-10-CM | POA: Insufficient documentation

## 2018-05-27 DIAGNOSIS — Z3A33 33 weeks gestation of pregnancy: Secondary | ICD-10-CM | POA: Diagnosis not present

## 2018-05-27 DIAGNOSIS — O98513 Other viral diseases complicating pregnancy, third trimester: Secondary | ICD-10-CM | POA: Insufficient documentation

## 2018-05-27 DIAGNOSIS — O26893 Other specified pregnancy related conditions, third trimester: Secondary | ICD-10-CM | POA: Diagnosis present

## 2018-05-27 DIAGNOSIS — H6501 Acute serous otitis media, right ear: Secondary | ICD-10-CM | POA: Diagnosis not present

## 2018-05-27 DIAGNOSIS — J101 Influenza due to other identified influenza virus with other respiratory manifestations: Secondary | ICD-10-CM

## 2018-05-27 LAB — BASIC METABOLIC PANEL
Anion gap: 7 (ref 5–15)
BUN: <5 mg/dL — ABNORMAL LOW (ref 6–20)
CHLORIDE: 107 mmol/L (ref 98–111)
CO2: 21 mmol/L — ABNORMAL LOW (ref 22–32)
Calcium: 8.7 mg/dL — ABNORMAL LOW (ref 8.9–10.3)
Creatinine, Ser: 0.5 mg/dL (ref 0.44–1.00)
GFR calc Af Amer: >60 mL/min (ref >60)
GFR calc non Af Amer: >60 mL/min (ref >60)
Glucose, Bld: 145 mg/dL — ABNORMAL HIGH (ref 70–99)
Potassium: 3.2 mmol/L — ABNORMAL LOW (ref 3.5–5.1)
Sodium: 135 mmol/L (ref 135–145)

## 2018-05-27 LAB — CBC WITH DIFFERENTIAL/PLATELET
Abs Immature Granulocytes: 0.03 10*3/uL (ref 0.00–0.07)
Basophils Absolute: 0.1 10*3/uL (ref 0.0–0.1)
Basophils Relative: 1 %
Eosinophils Absolute: 0 10*3/uL (ref 0.0–0.5)
Eosinophils Relative: 0 %
HCT: 34.2 % — ABNORMAL LOW (ref 36.0–46.0)
Hemoglobin: 11.3 g/dL — ABNORMAL LOW (ref 12.0–15.0)
Immature Granulocytes: 0 %
LYMPHS ABS: 0.6 10*3/uL — AB (ref 0.7–4.0)
Lymphocytes Relative: 7 %
MCH: 28.3 pg (ref 26.0–34.0)
MCHC: 33 g/dL (ref 30.0–36.0)
MCV: 85.7 fL (ref 80.0–100.0)
MONOS PCT: 7 %
Monocytes Absolute: 0.7 10*3/uL (ref 0.1–1.0)
Neutro Abs: 7.9 10*3/uL — ABNORMAL HIGH (ref 1.7–7.7)
Neutrophils Relative %: 85 %
Platelets: 241 10*3/uL (ref 150–400)
RBC: 3.99 MIL/uL (ref 3.87–5.11)
RDW: 12.8 % (ref 11.5–15.5)
WBC: 9.3 10*3/uL (ref 4.0–10.5)
nRBC: 0 % (ref 0.0–0.2)

## 2018-05-27 LAB — INFLUENZA PANEL BY PCR (TYPE A & B)
Influenza A By PCR: POSITIVE — AB
Influenza B By PCR: NEGATIVE

## 2018-05-27 LAB — TSH: TSH: 0.961 u[IU]/mL (ref 0.350–4.500)

## 2018-05-27 LAB — TROPONIN I: Troponin I: <0.03 ng/mL (ref <0.03)

## 2018-05-27 MED ORDER — OSELTAMIVIR PHOSPHATE 75 MG PO CAPS
75.0000 mg | ORAL_CAPSULE | Freq: Once | ORAL | Status: AC
  Administered 2018-05-27: 75 mg via ORAL
  Filled 2018-05-27: qty 1

## 2018-05-27 MED ORDER — OSELTAMIVIR PHOSPHATE 75 MG PO CAPS: 75.0000 mg | ORAL_CAPSULE | Freq: Two times a day (BID) | ORAL | 0 refills | Status: AC

## 2018-05-27 MED ORDER — SODIUM CHLORIDE 0.9 % IV BOLUS
1000.0000 mL | Freq: Once | INTRAVENOUS | Status: AC
  Administered 2018-05-27: 1000 mL via INTRAVENOUS

## 2018-05-27 MED ORDER — SODIUM CHLORIDE 0.9 % IV BOLUS
500.0000 mL | Freq: Once | INTRAVENOUS | Status: AC
  Administered 2018-05-27: 500 mL via INTRAVENOUS

## 2018-05-27 NOTE — ED Provider Notes (Signed)
[facility] Emergency Department Provider Note  ____________________________________________   First MD Initiated Contact with Patient 05/27/18 1753     (approximate)  I have reviewed the triage vital signs and the nursing notes.   HISTORY  Chief Complaint Fever   HPI Ashley Hunter is a 33 y.o. female who is approximately 5433 weeks pregnant with a history of hypothyroidism on Synthroid who is presented emergency department today complaining of right-sided ear pain over the past week as well as a subjective fever.  Says that she also has pain to the right side of her thoracic back radiating into her neck.  Does not report headache.  Denies any cough.  Says that she did experience some shortness of breath upon arrival to the emergency department but is denying any shortness of breath this time.  Denying any abdominal pain, vaginal bleeding or discharge.  Says that she feels her baby move.  No known sick exposures.  No cough, runny nose.   Past Medical History:  Diagnosis Date  . Hypothyroidism     Patient Active Problem List   Diagnosis Date Noted  . Antepartum fetal growth restriction 04/28/2018  . Abnormal glucose tolerance in mother complicating pregnancy 04/17/2018  . Pregnancy affected by fetal growth restriction 04/14/2018  . minimal left fetal renal pelviectasis  03/13/2018  . Fetal size inconsistent with dates 02/25/2018  . Hypothyroid in pregnancy, antepartum 02/25/2018  . Nausea and vomiting during pregnancy 02/25/2018  . BMI 33.0-33.9,adult 12/06/2017  . History of cesarean delivery, currently pregnant 11/21/2017  . Supervision of high risk pregnancy, antepartum 11/21/2017  . Obesity affecting pregnancy 11/21/2017    Past Surgical History:  Procedure Laterality Date  . APPENDECTOMY    . CESAREAN SECTION N/A 04/27/2016   Procedure: CESAREAN SECTION FOR BREECH PRESENTATION;  Surgeon: Conard NovakStephen D Jackson, MD;  Location: [facility] ORS;  Service:  Obstetrics;  Laterality: N/A;  Baby girl born @ 1300 Apgars: 8/9 Weight: 5lbs 9ozs    Prior to Admission medications   Medication Sig Start Date End Date Taking? Authorizing Provider  cefixime (SUPRAX) 400 MG CAPS capsule Take 1 capsule (400 mg total) by mouth daily. Patient not taking: Reported on 05/26/2018 05/23/18   Nadara MustardHarris, Robert P, MD  levothyroxine (SYNTHROID, LEVOTHROID) 125 MCG tablet Take 1 tablet (125 mcg total) by mouth daily before breakfast. 05/02/18   Conard NovakJackson, Stephen D, MD  neomycin-colistin-hydrocortisone-thonzonium (COLY-MYCIN S) 3.06-23-08-0.5 MG/ML OTIC suspension Place 4 drops into the right ear 4 (four) times daily. Patient not taking: Reported on 05/26/2018 05/23/18   Nadara MustardHarris, Robert P, MD  Prenat-FeFum-FePo-FA-Omega 3 (CONCEPT DHA) 53.5-38-1 MG CAPS Take 1 capsule by mouth daily. 04/12/18   [provider]    Allergies Patient has no known allergies.  Family History  Problem Relation Age of Onset  . Diabetes Paternal Grandfather     Social History Social History   Tobacco Use  . Smoking status: Never Smoker  . Smokeless tobacco: Never Used  Substance Use Topics  . Alcohol use: No  . Drug use: No    Review of Systems  Constitutional: No fever/chills Eyes: No visual changes. ENT: No sore throat. Cardiovascular: Denies chest pain. Respiratory: As above Gastrointestinal: No abdominal pain.  No nausea, no vomiting.  No diarrhea.  No constipation. Genitourinary: Negative for dysuria. Musculoskeletal: As above Skin: Negative for rash. Neurological: Negative for headaches, focal weakness or numbness.   ____________________________________________   PHYSICAL EXAM:  VITAL SIGNS: ED Triage Vitals  Enc Vitals Group  BP 05/27/18 1747 115/64     Pulse Rate 05/27/18 1747 (!) 141     Resp 05/27/18 1744 16     Temp 05/27/18 1744 97.8 F (36.6 C)     Temp Source 05/27/18 1744 Oral     SpO2 05/27/18 1747 98 %     Weight --      Height --       Head Circumference --      Peak Flow --      Pain Score 05/27/18 1747 6     Pain Loc --      Pain Edu? --      Excl. in GC? --     Constitutional: Alert and oriented. Well appearing and in no acute distress. Eyes: Conjunctivae are normal.  Head: Atraumatic.  Right-sided TM with fluid behind it as well as slight opacification but no bulging or erythema.  Left TM normal. Nose: No congestion/rhinnorhea. Mouth/Throat: Mucous membranes are moist.  Neck: No stridor.   Cardiovascular: Tachycardic, regular rhythm. Grossly normal heart sounds. Respiratory: Normal respiratory effort.  No retractions. Lungs CTAB. Gastrointestinal: Soft and nontender.  Gravid uterus consistent with dates.   Musculoskeletal: No lower extremity tenderness nor edema.  No joint effusions. Neurologic:  Normal speech and language. No gross focal neurologic deficits are appreciated. Skin:  Skin is warm, dry and intact. No rash noted. Psychiatric: Mood and affect are normal. Speech and behavior are normal.  ____________________________________________   LABS (all labs ordered are listed, but only abnormal results are displayed)  Labs Reviewed  CBC WITH DIFFERENTIAL/PLATELET - Abnormal; Notable for the following components:      Result Value   Hemoglobin 11.3 (*)    HCT 34.2 (*)    Neutro Abs 7.9 (*)    Lymphs Abs 0.6 (*)    All other components within normal limits  BASIC METABOLIC PANEL - Abnormal; Notable for the following components:   Potassium 3.2 (*)    CO2 21 (*)    Glucose, Bld 145 (*)    BUN <5 (*)    Calcium 8.7 (*)    All other components within normal limits  INFLUENZA PANEL BY PCR (TYPE A & B) - Abnormal; Notable for the following components:   Influenza A By PCR POSITIVE (*)    All other components within normal limits  TROPONIN I  TSH  URINALYSIS, COMPLETE (UACMP) WITH MICROSCOPIC   ____________________________________________  EKG  ED ECG REPORT I, Arelia Longest, the attending  physician, personally viewed and interpreted this ECG.   Date: 05/27/2018  EKG Time: 1800  Rate: 134  Rhythm: sinus tachycardia  Axis: Normal  Intervals:none  ST&T Change: No ST segment elevation or depression.  No abnormal T wave inversion.  ____________________________________________  RADIOLOGY   ____________________________________________   PROCEDURES  Procedure(s) performed:   Procedures  Critical Care performed:   ____________________________________________   INITIAL IMPRESSION / ASSESSMENT AND PLAN / ED COURSE  Pertinent labs & imaging results that were available during my care of the patient were reviewed by me and considered in my medical decision making (see chart for details).  DDX: Influenza, dehydration, hyperthyroidism, otitis media, pulmonary embolus, pneumonia As part of my medical decision making, I reviewed the following data within the electronic MEDICAL RECORD NUMBER Notes from prior ED visits  Fetal heart tones taken in are in the 140s.  ----------------------------------------- 8:41 PM on 05/27/2018 -----------------------------------------  Patient with heart rate down to 102 after 1/2 L of fluid.  Feeling improved  at this time.  Flu a positive.  Will treat with Tamiflu.  Patient likely dehydrated causing elevated heart rate.  Discussed possibility of chest x-ray with the patient and she would like to defer at this time.  Says that her cough is mild.  Lungs are clear.  Less likely pneumonia.  She is understanding the diagnosis well treatment and willing to comply.  We discussed staying well-hydrated drinking plenty of fluids.  She will be following up with her OB/GYN, Dr. Jean RosenthalJackson. ____________________________________________   FINAL CLINICAL IMPRESSION(S) / ED DIAGNOSES  Influenza.  Serous otitis media.  NEW MEDICATIONS STARTED DURING THIS VISIT:  New Prescriptions   No medications on file     Note:  This document was prepared using Dragon  voice recognition software and may include unintentional dictation errors.     Myrna BlazerSchaevitz, Damisha Wolff Matthew, MD 05/27/18 2041

## 2018-05-27 NOTE — ED Triage Notes (Signed)
PT c/o RT ear infection with fever today. Pt 33wks preg. HR 130s in triage

## 2018-05-29 ENCOUNTER — Other Ambulatory Visit: Payer: Medicaid Other

## 2018-05-29 ENCOUNTER — Encounter: Payer: Self-pay | Admitting: Obstetrics and Gynecology

## 2018-05-29 ENCOUNTER — Ambulatory Visit (INDEPENDENT_AMBULATORY_CARE_PROVIDER_SITE_OTHER): Payer: Medicaid Other | Admitting: Obstetrics and Gynecology

## 2018-05-29 ENCOUNTER — Telehealth: Payer: Self-pay

## 2018-05-29 VITALS — BP 114/74 | Wt 186.0 lb

## 2018-05-29 DIAGNOSIS — O99280 Endocrine, nutritional and metabolic diseases complicating pregnancy, unspecified trimester: Secondary | ICD-10-CM

## 2018-05-29 DIAGNOSIS — Z6833 Body mass index (BMI) 33.0-33.9, adult: Secondary | ICD-10-CM

## 2018-05-29 DIAGNOSIS — E039 Hypothyroidism, unspecified: Secondary | ICD-10-CM

## 2018-05-29 DIAGNOSIS — O365930 Maternal care for other known or suspected poor fetal growth, third trimester, not applicable or unspecified: Secondary | ICD-10-CM

## 2018-05-29 DIAGNOSIS — O99283 Endocrine, nutritional and metabolic diseases complicating pregnancy, third trimester: Secondary | ICD-10-CM | POA: Diagnosis not present

## 2018-05-29 DIAGNOSIS — O99213 Obesity complicating pregnancy, third trimester: Secondary | ICD-10-CM | POA: Diagnosis not present

## 2018-05-29 DIAGNOSIS — Z3A33 33 weeks gestation of pregnancy: Secondary | ICD-10-CM

## 2018-05-29 DIAGNOSIS — O34219 Maternal care for unspecified type scar from previous cesarean delivery: Secondary | ICD-10-CM | POA: Diagnosis not present

## 2018-05-29 DIAGNOSIS — O0990 Supervision of high risk pregnancy, unspecified, unspecified trimester: Secondary | ICD-10-CM

## 2018-05-29 DIAGNOSIS — O365990 Maternal care for other known or suspected poor fetal growth, unspecified trimester, not applicable or unspecified: Secondary | ICD-10-CM

## 2018-05-29 MED ORDER — CONCEPT DHA 53.5-38-1 MG PO CAPS: 1.0000 | ORAL_CAPSULE | Freq: Every day | ORAL | 11 refills | Status: AC

## 2018-05-29 NOTE — Telephone Encounter (Signed)
Pt aware.

## 2018-05-29 NOTE — Progress Notes (Signed)
Routine Prenatal Care Visit  Subjective  Ashley Hunter is a 33 y.o. G2P1001 at [redacted]w[redacted]d being seen today for ongoing prenatal care.  She is currently monitored for the following issues for this high-risk pregnancy and has History of cesarean delivery, currently pregnant; Supervision of high risk pregnancy, antepartum; Obesity affecting pregnancy; BMI 33.0-33.9,adult; Fetal size inconsistent with dates; Hypothyroid in pregnancy, antepartum; Nausea and vomiting during pregnancy; minimal left fetal renal pelviectasis ; Pregnancy affected by fetal growth restriction; Abnormal glucose tolerance in mother complicating pregnancy; and Antepartum fetal growth restriction on their problem list.  ----------------------------------------------------------------------------------- Patient reports no complaints.   Contractions: Not present. Vag. Bleeding: None.  Movement: Present. Denies leaking of fluid.  U/S at [facility] on 2/3 shows normal AFI and wnl UA dopplers. Will get growth u/s on 2/13 at [facility]. Recent diagnosis of Flu A.  Tried Tamiflu, but made her feel worse.  Is feeling better today, but still has sinus issues. Taking Tylenol is helping. Has not gotten her antibiotics as she states it was never sent.  ----------------------------------------------------------------------------------- The following portions of the patient's history were reviewed and updated as appropriate: allergies, current medications, past family history, past medical history, past social history, past surgical history and problem list. Problem list updated.   Objective  Blood pressure 114/74, weight 186 lb (84.4 kg), last menstrual period 10/05/2017, unknown if currently breastfeeding. Pregravid weight 179 lb (81.2 kg) Total Weight Gain 7 lb (3.175 kg) Urinalysis: Urine Protein    Urine Glucose    Fetal Status: Fetal Heart Rate (bpm): 120   Movement: Present     General:  Alert, oriented and cooperative. Patient is in no acute  distress.  Skin: Skin is warm and dry. No rash noted.   Cardiovascular: Normal heart rate noted  Respiratory: Normal respiratory effort, no problems with respiration noted  Abdomen: Soft, gravid, appropriate for gestational age. Pain/Pressure: Absent     Pelvic:  Cervical exam deferred        Extremities: Normal range of motion.  Edema: None  Mental Status: Normal mood and affect. Normal behavior. Normal judgment and thought content.   NST Baseline FHR: 120 beats/min Variability: moderate Accelerations: present Decelerations: absent Tocometry: not done  Interpretation:  INDICATIONS: severe fetal growth restriction RESULTS:  A NST procedure was performed with FHR monitoring and a normal baseline established, appropriate time of 20-40 minutes of evaluation, and accels >2 seen w 15x15 characteristics.  Results show a REACTIVE NST.    Assessment   32 y.o. G2P1001 at [redacted]w[redacted]d by  07/12/2018, by Last Menstrual Period presenting for routine prenatal visit  Plan   pregnancy 2 Problems (from 10/05/17 to present)    Problem Noted Resolved   Abnormal glucose tolerance in mother complicating pregnancy 04/17/2018 by Conard Novak, MD No   Pregnancy affected by fetal growth restriction 04/14/2018 by Conard Novak, MD No   Overview Addendum 04/30/2018 12:46 PM by Conard Novak, MD    - 10th %ile at 22 wks - 7th %ile at 26 weeks with normal UA dopplers - <3rd %ile at 29 weeks with normal UA dopplers - co-managed by [facility] - twice weekly NST, weekly AFI/UA Doppls - Growth q 3 weeks       Fetal size inconsistent with dates 02/25/2018 by Conard Novak, MD No   Overview Addendum 03/13/2018 12:11 PM by Jimmey Ralph, MD    -Ultrasound on 02/25/2018 gives EDD of 07/24/2018.  Her EDD based on an early ultrasound is 07/12/2018.  Given a greater than 10-day discrepancy, will refer to [facility] for further evaluation and completion of her anatomy ultrasound. 03/13/18 - [facility] -  EFW 10th percetnile by LMP , 24th by u/s dates - will f/u in 3 weeks       Hypothyroid in pregnancy, antepartum 02/25/2018 by Conard Novak, MD No   Nausea and vomiting during pregnancy 02/25/2018 by Conard Novak, MD No   BMI 33.0-33.9,adult 12/06/2017 by Conard Novak, MD No   History of cesarean delivery, currently pregnant 11/21/2017 by Tresea Mall, CNM No   Supervision of high risk pregnancy, antepartum 11/21/2017 by Tresea Mall, CNM No   Overview Addendum 04/01/2018  7:22 PM by Natale Milch, MD    Clinic [facility] Prenatal Labs  Dating  LMP = 8wk [place] Blood type: B/Positive/-- (09/23 1536)   Genetic Screen  NIPS: normal XX Antibody:Negative (09/23 1536)  Anatomic [place] Incomplete heart views,  gorwth 10% at 22 wks Rubella: 19.30 (09/23 1536) Varicella: immune  GTT Early: 110              Third trimester:  RPR: Non Reactive (09/23 1536)   Rhogam  HBsAg: Negative (09/23 1536)   TDaP vaccine                       Flu Shot: HIV: Non Reactive (09/23 1536)   Baby Food                                GBS:   Contraception  Pap: [ ]    CBB     CS/VBAC Primary breech/oligo 2017   Support Person Husband Hemal         Obesity affecting pregnancy 11/21/2017 by Tresea Mall, CNM No       Preterm labor symptoms and general obstetric precautions including but not limited to vaginal bleeding, contractions, leaking of fluid and fetal movement were reviewed in detail with the patient. Please refer to After Visit Summary for other counseling recommendations.   Return in about 4 days (around 06/02/2018) for Routine Prenatal Appointment with NST.  Thomasene Mohair, MD, [facility], [facility] 05/29/2018 12:30 PM

## 2018-05-29 NOTE — Telephone Encounter (Signed)
Tried to call pt, went to VM and VM box full. Can you try to call this PM since I will be out with my daughter please?

## 2018-05-29 NOTE — Telephone Encounter (Signed)
-----   Message from Conard Novak, MD sent at 05/29/2018 12:34 PM EST ----- Regarding: Please call patient Would you mind calling this patient and letting her know that I called in the prenatal vitamins. Also, the antibiotic that was called in by Dr. Tiburcio Pea was sent to [facility] on 1/31. If she needs [place] to send to a different pharmacy, we can. Thank you!

## 2018-06-02 ENCOUNTER — Encounter: Payer: Medicaid Other | Admitting: Certified Nurse Midwife

## 2018-06-02 ENCOUNTER — Other Ambulatory Visit: Payer: Self-pay

## 2018-06-02 DIAGNOSIS — O365990 Maternal care for other known or suspected poor fetal growth, unspecified trimester, not applicable or unspecified: Secondary | ICD-10-CM

## 2018-06-05 ENCOUNTER — Encounter: Payer: Medicaid Other | Admitting: Obstetrics and Gynecology

## 2018-06-05 ENCOUNTER — Ambulatory Visit
Admission: RE | Admit: 2018-06-05 | Discharge: 2018-06-05 | Disposition: A | Discharge status: Home / Self Care | Payer: Medicaid Other | Source: Ambulatory Visit | Attending: Maternal & Fetal Medicine | Admitting: Maternal & Fetal Medicine

## 2018-06-05 DIAGNOSIS — Z3A34 34 weeks gestation of pregnancy: Secondary | ICD-10-CM | POA: Diagnosis not present

## 2018-06-05 DIAGNOSIS — O365990 Maternal care for other known or suspected poor fetal growth, unspecified trimester, not applicable or unspecified: Secondary | ICD-10-CM | POA: Diagnosis present

## 2018-06-09 ENCOUNTER — Other Ambulatory Visit: Payer: Self-pay

## 2018-06-09 DIAGNOSIS — O365990 Maternal care for other known or suspected poor fetal growth, unspecified trimester, not applicable or unspecified: Secondary | ICD-10-CM

## 2018-06-12 ENCOUNTER — Other Ambulatory Visit: Payer: Self-pay

## 2018-06-12 ENCOUNTER — Inpatient Hospital Stay: Admission: RE | Admit: 2018-06-12 | Payer: Medicaid Other | Source: Ambulatory Visit

## 2018-06-12 DIAGNOSIS — O365990 Maternal care for other known or suspected poor fetal growth, unspecified trimester, not applicable or unspecified: Secondary | ICD-10-CM

## 2018-06-13 ENCOUNTER — Emergency Department
Admission: EM | Admit: 2018-06-13 | Discharge: 2018-06-13 | Disposition: A | Discharge status: Home / Self Care | Payer: Medicaid Other | Attending: Emergency Medicine | Admitting: Emergency Medicine

## 2018-06-13 ENCOUNTER — Encounter: Payer: Self-pay | Admitting: Emergency Medicine

## 2018-06-13 ENCOUNTER — Other Ambulatory Visit: Payer: Self-pay

## 2018-06-13 DIAGNOSIS — E039 Hypothyroidism, unspecified: Secondary | ICD-10-CM | POA: Diagnosis not present

## 2018-06-13 DIAGNOSIS — H6593 Unspecified nonsuppurative otitis media, bilateral: Secondary | ICD-10-CM | POA: Insufficient documentation

## 2018-06-13 DIAGNOSIS — Z79899 Other long term (current) drug therapy: Secondary | ICD-10-CM | POA: Diagnosis not present

## 2018-06-13 DIAGNOSIS — H9203 Otalgia, bilateral: Secondary | ICD-10-CM | POA: Diagnosis present

## 2018-06-13 MED ORDER — FLUTICASONE PROPIONATE 50 MCG/ACT NA SUSP: 1.0000 | Freq: Every day | NASAL | 2 refills | Status: DC

## 2018-06-13 MED ORDER — CETIRIZINE HCL 10 MG PO TABS: 10.0000 mg | ORAL_TABLET | Freq: Every day | ORAL | 0 refills | Status: DC

## 2018-06-13 NOTE — ED Triage Notes (Signed)
Patient reports she was seen 3 weeks ago for ear infection in right ear. Reports 2 days ago she started having pain in bilateral ears. Pt [redacted] weeks pregnant.

## 2018-06-13 NOTE — Discharge Instructions (Signed)
Take Zyrtec one daily.  Take Flonase, one spray each nostril once daily.

## 2018-06-13 NOTE — ED Provider Notes (Signed)
Emergency Department Provider Note  ____________________________________________  Time seen: Approximately 4:18 PM  I have reviewed the triage vital signs and the nursing notes.   HISTORY  Chief Complaint Otalgia    HPI Ashley Hunter is a 33 y.o. female presents to the emergency department with bilateral ear pain.  Patient reports that she was diagnosed with otitis media several weeks ago and took approximately 5 days of amoxicillin before she stopped the medication prematurely.  Patient reports the ear pain started in the right ear but she now states that she is starting to have pain on the left.  No fever or chills.  No discharge from the bilateral ears.  Patient denies hearing deficits.  Patient states that she can hear ears "pop" several times a day.  No other alleviating measures have been attempted.   Past Medical History:  Diagnosis Date  . Hypothyroidism     Patient Active Problem List   Diagnosis Date Noted  . Antepartum fetal growth restriction 04/28/2018  . Abnormal glucose tolerance in mother complicating pregnancy 04/17/2018  . Pregnancy affected by fetal growth restriction 04/14/2018  . minimal left fetal renal pelviectasis  03/13/2018  . Fetal size inconsistent with dates 02/25/2018  . Hypothyroid in pregnancy, antepartum 02/25/2018  . Nausea and vomiting during pregnancy 02/25/2018  . BMI 33.0-33.9,adult 12/06/2017  . History of cesarean delivery, currently pregnant 11/21/2017  . Supervision of high risk pregnancy, antepartum 11/21/2017  . Obesity affecting pregnancy 11/21/2017    Past Surgical History:  Procedure Laterality Date  . APPENDECTOMY    . CESAREAN SECTION N/A 04/27/2016   Procedure: CESAREAN SECTION FOR BREECH PRESENTATION;  Surgeon: Conard Novak, MD;  Location: [facility] ORS;  Service: Obstetrics;  Laterality: N/A;  Baby girl born @ 1300 Apgars: 8/9 Weight: 5lbs 9ozs    Prior to Admission medications   Medication Sig Start Date End  Date Taking? Authorizing Provider  cetirizine (ZYRTEC ALLERGY) 10 MG tablet Take 1 tablet (10 mg total) by mouth daily for 7 days. 06/13/18 06/20/18  Orvil Feil, PA-C  fluticasone (FLONASE) 50 MCG/ACT nasal spray Place 1 spray into both nostrils daily for 7 days. 06/13/18 06/20/18  Orvil Feil, PA-C  levothyroxine (SYNTHROID, LEVOTHROID) 125 MCG tablet Take 1 tablet (125 mcg total) by mouth daily before breakfast. 05/02/18   Conard Novak, MD  Prenat-FeFum-FePo-FA-Omega 3 (CONCEPT DHA) 53.5-38-1 MG CAPS Take 1 capsule by mouth daily. 05/29/18   Conard Novak, MD    Allergies Patient has no known allergies.  Family History  Problem Relation Age of Onset  . Diabetes Paternal Grandfather     Social History Social History   Tobacco Use  . Smoking status: Never Smoker  . Smokeless tobacco: Never Used  Substance Use Topics  . Alcohol use: No  . Drug use: No     Review of Systems  Constitutional: No fever/chills Eyes: No visual changes. No discharge ENT: Patient has bilateral ear pain.  Cardiovascular: no chest pain. Respiratory: no cough. No SOB. Gastrointestinal: No abdominal pain.  No nausea, no vomiting.  No diarrhea.  No constipation. Genitourinary: Negative for dysuria. No hematuria Musculoskeletal: Negative for musculoskeletal pain. Skin: Negative for rash, abrasions, lacerations, ecchymosis. Neurological: Negative for headaches, focal weakness or numbness.   ____________________________________________   PHYSICAL EXAM:  VITAL SIGNS: ED Triage Vitals  Enc Vitals Group     BP 06/13/18 1504 118/70     Pulse Rate 06/13/18 1504 94     Resp 06/13/18 1504 20  Temp 06/13/18 1504 98.8 F (37.1 C)     Temp Source 06/13/18 1504 Oral     SpO2 06/13/18 1504 99 %     Weight 06/13/18 1505 185 lb 3 oz (84 kg)     Height 06/13/18 1505 5\' 1"  (1.549 m)     Head Circumference --      Peak Flow --      Pain Score 06/13/18 1505 7     Pain Loc --      Pain Edu?  --      Excl. in GC? --      Constitutional: Alert and oriented. Well appearing and in no acute distress. Eyes: Conjunctivae are normal. PERRL. EOMI. Head: Atraumatic. ENT:      Ears: TMs are effused bilaterally without erythema or evidence of purulent exudate behind TMs.      Nose: No congestion/rhinnorhea.      Mouth/Throat: Mucous membranes are moist.  Neck: No stridor.  No cervical spine tenderness to palpation.  Cardiovascular: Normal rate, regular rhythm. Normal S1 and S2.  Good peripheral circulation. Respiratory: Normal respiratory effort without tachypnea or retractions. Lungs CTAB. Good air entry to the bases with no decreased or absent breath sounds.  Skin:  Skin is warm, dry and intact. No rash noted.  ____________________________________________   LABS (all labs ordered are listed, but only abnormal results are displayed)  Labs Reviewed - No data to display ____________________________________________  EKG   ____________________________________________  RADIOLOGY   No results found.  ____________________________________________    PROCEDURES  Procedure(s) performed:    Procedures    Medications - No data to display   ____________________________________________   INITIAL IMPRESSION / ASSESSMENT AND PLAN / ED COURSE  Pertinent labs & imaging results that were available during my care of the patient were reviewed by me and considered in my medical decision making (see chart for details).  Review of the [place] CSRS was performed in accordance of the [facility] prior to dispensing any controlled drugs.      Assessment and Plan: Otitis media with effusion Patient presents to the emergency department with bilateral ear pain.  On physical exam, admitting membranes were effused bilaterally without signs of infection.  I advised Flonase daily with Zyrtec for the next week.  Patient was advised to follow-up with primary care as needed.  All patient questions  were answered.   ____________________________________________  FINAL CLINICAL IMPRESSION(S) / ED DIAGNOSES  Final diagnoses:  Bilateral otitis media with effusion      NEW MEDICATIONS STARTED DURING THIS VISIT:  ED Discharge Orders         Ordered    cetirizine (ZYRTEC ALLERGY) 10 MG tablet  Daily     06/13/18 1613    fluticasone (FLONASE) 50 MCG/ACT nasal spray  Daily     06/13/18 1613              This chart was dictated using voice recognition software/Dragon. Despite best efforts to proofread, errors can occur which can change the meaning. Any change was purely unintentional.    Orvil Feil, PA-C 06/13/18 1622    Arnaldo Natal, MD 06/13/18 [phone]

## 2018-06-16 ENCOUNTER — Ambulatory Visit
Admission: RE | Admit: 2018-06-16 | Discharge: 2018-06-16 | Disposition: A | Discharge status: Home / Self Care | Payer: Medicaid Other | Source: Ambulatory Visit | Attending: Maternal & Fetal Medicine | Admitting: Maternal & Fetal Medicine

## 2018-06-16 DIAGNOSIS — O365931 Maternal care for other known or suspected poor fetal growth, third trimester, fetus 1: Secondary | ICD-10-CM | POA: Insufficient documentation

## 2018-06-16 DIAGNOSIS — Z3A36 36 weeks gestation of pregnancy: Secondary | ICD-10-CM | POA: Insufficient documentation

## 2018-06-16 DIAGNOSIS — O365990 Maternal care for other known or suspected poor fetal growth, unspecified trimester, not applicable or unspecified: Secondary | ICD-10-CM

## 2018-06-17 ENCOUNTER — Encounter: Payer: Medicaid Other | Admitting: Obstetrics and Gynecology

## 2018-06-19 ENCOUNTER — Other Ambulatory Visit: Payer: Medicaid Other

## 2018-06-23 ENCOUNTER — Other Ambulatory Visit: Payer: Self-pay

## 2018-06-23 DIAGNOSIS — O365990 Maternal care for other known or suspected poor fetal growth, unspecified trimester, not applicable or unspecified: Secondary | ICD-10-CM

## 2018-06-24 ENCOUNTER — Encounter: Payer: Self-pay | Admitting: Obstetrics and Gynecology

## 2018-06-24 ENCOUNTER — Ambulatory Visit (INDEPENDENT_AMBULATORY_CARE_PROVIDER_SITE_OTHER): Payer: Medicaid Other | Admitting: Obstetrics and Gynecology

## 2018-06-24 VITALS — BP 118/60 | HR 83 | Wt 188.0 lb

## 2018-06-24 DIAGNOSIS — O34219 Maternal care for unspecified type scar from previous cesarean delivery: Secondary | ICD-10-CM

## 2018-06-24 DIAGNOSIS — O99213 Obesity complicating pregnancy, third trimester: Secondary | ICD-10-CM

## 2018-06-24 DIAGNOSIS — O99280 Endocrine, nutritional and metabolic diseases complicating pregnancy, unspecified trimester: Secondary | ICD-10-CM

## 2018-06-24 DIAGNOSIS — O99283 Endocrine, nutritional and metabolic diseases complicating pregnancy, third trimester: Secondary | ICD-10-CM

## 2018-06-24 DIAGNOSIS — E039 Hypothyroidism, unspecified: Secondary | ICD-10-CM

## 2018-06-24 DIAGNOSIS — O365930 Maternal care for other known or suspected poor fetal growth, third trimester, not applicable or unspecified: Secondary | ICD-10-CM

## 2018-06-24 DIAGNOSIS — Z3A37 37 weeks gestation of pregnancy: Secondary | ICD-10-CM

## 2018-06-24 DIAGNOSIS — Z6833 Body mass index (BMI) 33.0-33.9, adult: Secondary | ICD-10-CM

## 2018-06-24 DIAGNOSIS — O0990 Supervision of high risk pregnancy, unspecified, unspecified trimester: Secondary | ICD-10-CM

## 2018-06-24 DIAGNOSIS — O365990 Maternal care for other known or suspected poor fetal growth, unspecified trimester, not applicable or unspecified: Secondary | ICD-10-CM

## 2018-06-24 NOTE — Progress Notes (Signed)
OB History & Physical   History of Present Illness:  Chief Complaint: Here for surgery pre-operative visit  HPI:  Ashley Hunter is a 33 y.o. G50P1001 female at [redacted]w[redacted]d dated by LMP consistent with 8 week ultrasound.  Her pregnancy has been complicated by history of prior cesarean delivery, not attending all recommended appointments, severe fetal growth restriction (<3rd %ile), hypothyroidism.    She denies contractions.   She denies leakage of fluid.   She denies vaginal bleeding.   She reports fetal movement.    Maternal Medical History:   Past Medical History:  Diagnosis Date  . Hypothyroidism     Past Surgical History:  Procedure Laterality Date  . APPENDECTOMY    . CESAREAN SECTION N/A 04/27/2016   Procedure: CESAREAN SECTION FOR BREECH PRESENTATION;  Surgeon: Conard Novak, MD;  Location: [facility] ORS;  Service: Obstetrics;  Laterality: N/A;  Baby girl born @ 1300 Apgars: 8/9 Weight: 5lbs 9ozs    No Known Allergies  Prior to Admission medications   Medication Sig Start Date End Date Taking? Authorizing Provider  levothyroxine (SYNTHROID, LEVOTHROID) 125 MCG tablet Take 1 tablet (125 mcg total) by mouth daily before breakfast. 05/02/18  Yes Conard Novak, MD  Prenat-FeFum-FePo-FA-Omega 3 (CONCEPT DHA) 53.5-38-1 MG CAPS Take 1 capsule by mouth daily. Patient taking differently: Take 1 capsule by mouth at bedtime.  05/29/18  Yes Conard Novak, MD  cetirizine (ZYRTEC ALLERGY) 10 MG tablet Take 1 tablet (10 mg total) by mouth daily for 7 days. Patient not taking: Reported on 06/16/2018 06/13/18 06/20/18  Orvil Feil, PA-C  fluticasone [facility]) 50 MCG/ACT nasal spray Place 1 spray into both nostrils daily for 7 days. 06/13/18 06/20/18  Orvil Feil, PA-C    OB History  Gravida Para Term Preterm AB Living  2 1 1     1   SAB TAB Ectopic Multiple Live Births          1    # Outcome Date GA Lbr Len/2nd Weight Sex Delivery Anes PTL Lv  2 Current           1 Term 2018     F CS-Unspec   LIV    Prenatal care site: [facility]  Social History: She  reports that she has never smoked. She has never used smokeless tobacco. She reports that she does not drink alcohol or use drugs.  Family History: family history includes Diabetes in her paternal grandfather.   Review of Systems: Negative x 10 systems reviewed except as noted in the HPI.    Physical Exam:  Vital Signs: BP 118/60   Pulse 83   Wt 188 lb (85.3 kg)   LMP 10/05/2017 (Exact Date)   BMI 35.52 kg/m  Constitutional: Well nourished, well developed female in no acute distress.  HEENT: normal Skin: Warm and dry.  Cardiovascular: Regular rate and rhythm.   Extremity: no edema  Respiratory: Clear to auscultation bilateral. Normal respiratory effort Abdomen: FHT present and gravid/NT Back: no CVAT Neuro: DTRs 2+, Cranial nerves grossly intact Psych: Alert and Oriented x3. No memory deficits. Normal mood and affect.  MS: normal gait, normal bilateral lower extremity ROM/strength/stability.   Pertinent Results:  Prenatal Labs: Blood type/Rh B positive  Antibody screen negative  Rubella Immune  Varicella Immune    RPR NR  HBsAg negative  HIV negative  GC declined  Chlamydia declined  Genetic screening NIPT normal  1 hour GTT Early = 110, 28 week 148 (4/4 normal  on 3hr gtt)  3 hour GTT normal  GBS Patient declined   Baseline FHR: 120 beats/min   Variability: moderate   Accelerations: present   Decelerations: absent Contractions: not done Overall assessment: category 1  Assessment:  Ashley Hunter is a 33 y.o. G65P1001 female at [redacted]w[redacted]d with severe fetal growth restriction here for pre-operative visit for cesarean delivery with bilateral tubal ligation. She has not followed the recommended fetal monitoring with twice weekly NSTs and weekly AFI, though she has done relatively well with AFI and UA doppler measurements. Here for repeat cesarean delivery.   Plan:  1. Admit to Labor &  Delivery  2. CBC, T&S, NPO, IVF 3. Fetwal well-being: reassuring 4. To OR for repeat cesarean delivery with tubal ligation.  Other reversible forms of contraception were discussed with patient; she declines all other modalities. Permanent nature of as well as associated risks of the procedure discussed with patient including but not limited to: risk of regret, permanence of method, bleeding, infection, injury to surrounding organs and need for additional procedures.  Failure risk of 0.5-1% with increased risk of ectopic gestation if pregnancy occurs was also discussed with patient.    Thomasene Mohair, MD 06/24/2018 3:25 PM

## 2018-06-24 NOTE — H&P (View-Only) (Signed)
OB History & Physical   History of Present Illness:  Chief Complaint: Here for surgery pre-operative visit  HPI:  Shanita J Hegel is a 33 y.o. G2P1001 female at [redacted]w[redacted]d dated by LMP consistent with 8 week ultrasound.  Her pregnancy has been complicated by history of prior cesarean delivery, not attending all recommended appointments, severe fetal growth restriction (<3rd %ile), hypothyroidism.    She denies contractions.   She denies leakage of fluid.   She denies vaginal bleeding.   She reports fetal movement.    Maternal Medical History:   Past Medical History:  Diagnosis Date  . Hypothyroidism     Past Surgical History:  Procedure Laterality Date  . APPENDECTOMY    . CESAREAN SECTION N/A 04/27/2016   Procedure: CESAREAN SECTION FOR BREECH PRESENTATION;  Surgeon: Bradley Bostelman D Nuchem Grattan, MD;  Location: [facility] ORS;  Service: Obstetrics;  Laterality: N/A;  Baby girl born @ 1300 Apgars: 8/9 Weight: 5lbs 9ozs    No Known Allergies  Prior to Admission medications   Medication Sig Start Date End Date Taking? Authorizing Provider  levothyroxine (SYNTHROID, LEVOTHROID) 125 MCG tablet Take 1 tablet (125 mcg total) by mouth daily before breakfast. 05/02/18  Yes Ellin Fitzgibbons D, MD  Prenat-FeFum-FePo-FA-Omega 3 (CONCEPT DHA) 53.5-38-1 MG CAPS Take 1 capsule by mouth daily. Patient taking differently: Take 1 capsule by mouth at bedtime.  05/29/18  Yes Blossom Crume D, MD  cetirizine (ZYRTEC ALLERGY) 10 MG tablet Take 1 tablet (10 mg total) by mouth daily for 7 days. Patient not taking: Reported on 06/16/2018 06/13/18 06/20/18  Woods, Jaclyn M, PA-C  fluticasone (FLONASE) 50 MCG/ACT nasal spray Place 1 spray into both nostrils daily for 7 days. 06/13/18 06/20/18  Woods, Jaclyn M, PA-C    OB History  Gravida Para Term Preterm AB Living  2 1 1     1  SAB TAB Ectopic Multiple Live Births          1    # Outcome Date GA Lbr Len/2nd Weight Sex Delivery Anes PTL Lv  2 Current           1 Term 2018     F CS-Unspec   LIV    Prenatal care site: [facility]  Social History: She  reports that she has never smoked. She has never used smokeless tobacco. She reports that she does not drink alcohol or use drugs.  Family History: family history includes Diabetes in her paternal grandfather.   Review of Systems: Negative x 10 systems reviewed except as noted in the HPI.    Physical Exam:  Vital Signs: BP 118/60   Pulse 83   Wt 188 lb (85.3 kg)   LMP 10/05/2017 (Exact Date)   BMI 35.52 kg/m  Constitutional: Well nourished, well developed female in no acute distress.  HEENT: normal Skin: Warm and dry.  Cardiovascular: Regular rate and rhythm.   Extremity: no edema  Respiratory: Clear to auscultation bilateral. Normal respiratory effort Abdomen: FHT present and gravid/NT Back: no CVAT Neuro: DTRs 2+, Cranial nerves grossly intact Psych: Alert and Oriented x3. No memory deficits. Normal mood and affect.  MS: normal gait, normal bilateral lower extremity ROM/strength/stability.   Pertinent Results:  Prenatal Labs: Blood type/Rh B positive  Antibody screen negative  Rubella Immune  Varicella Immune    RPR NR  HBsAg negative  HIV negative  GC declined  Chlamydia declined  Genetic screening NIPT normal  1 hour GTT Early = 110, 28 week 148 (4/4 normal   on 3hr gtt)  3 hour GTT normal  GBS Patient declined   Baseline FHR: 120 beats/min   Variability: moderate   Accelerations: present   Decelerations: absent Contractions: not done Overall assessment: category 1  Assessment:  ROCKIE WINKLER is a 33 y.o. G65P1001 female at [redacted]w[redacted]d with severe fetal growth restriction here for pre-operative visit for cesarean delivery with bilateral tubal ligation. She has not followed the recommended fetal monitoring with twice weekly NSTs and weekly AFI, though she has done relatively well with AFI and UA doppler measurements. Here for repeat cesarean delivery.   Plan:  1. Admit to Labor &  Delivery  2. CBC, T&S, NPO, IVF 3. Fetwal well-being: reassuring 4. To OR for repeat cesarean delivery with tubal ligation.  Other reversible forms of contraception were discussed with patient; she declines all other modalities. Permanent nature of as well as associated risks of the procedure discussed with patient including but not limited to: risk of regret, permanence of method, bleeding, infection, injury to surrounding organs and need for additional procedures.  Failure risk of 0.5-1% with increased risk of ectopic gestation if pregnancy occurs was also discussed with patient.    Thomasene Mohair, MD 06/24/2018 3:25 PM

## 2018-06-25 ENCOUNTER — Other Ambulatory Visit: Payer: Self-pay

## 2018-06-25 ENCOUNTER — Encounter
Admission: RE | Admit: 2018-06-25 | Discharge: 2018-06-25 | Disposition: A | Discharge status: Home / Self Care | Payer: Medicaid Other | Source: Ambulatory Visit | Attending: Obstetrics and Gynecology | Admitting: Obstetrics and Gynecology

## 2018-06-25 DIAGNOSIS — Z01812 Encounter for preprocedural laboratory examination: Secondary | ICD-10-CM

## 2018-06-25 LAB — CHLAMYDIA/NGC RT PCR (ARMC ONLY)
Chlamydia Tr: NOT DETECTED
N gonorrhoeae: NOT DETECTED

## 2018-06-25 LAB — TYPE AND SCREEN
ABO/RH(D): B POS
Antibody Screen: NEGATIVE
Extend sample reason: UNDETERMINED

## 2018-06-25 LAB — CBC
HCT: 35.2 % — ABNORMAL LOW (ref 36.0–46.0)
Hemoglobin: 11.3 g/dL — ABNORMAL LOW (ref 12.0–15.0)
MCH: 28 pg (ref 26.0–34.0)
MCHC: 32.1 g/dL (ref 30.0–36.0)
MCV: 87.3 fL (ref 80.0–100.0)
Platelets: 232 10*3/uL (ref 150–400)
RBC: 4.03 MIL/uL (ref 3.87–5.11)
RDW: 13.6 % (ref 11.5–15.5)
WBC: 8.6 10*3/uL (ref 4.0–10.5)
nRBC: 0 % (ref 0.0–0.2)

## 2018-06-25 LAB — RAPID HIV SCREEN (HIV 1/2 AB+AG)
HIV 1/2 Antibodies: NONREACTIVE
HIV-1 P24 Antigen - HIV24: NONREACTIVE

## 2018-06-25 MED ORDER — BUPIVACAINE HCL (PF) 0.5 % IJ SOLN
5.0000 mL | Freq: Once | INTRAMUSCULAR | Status: DC
  Filled 2018-06-25: qty 30

## 2018-06-25 MED ORDER — BUPIVACAINE 0.25 % ON-Q PUMP DUAL CATH 400 ML
400.0000 mL | INJECTION | Status: DC
  Filled 2018-06-25 (×2): qty 400

## 2018-06-25 MED ORDER — CEFAZOLIN SODIUM-DEXTROSE 2-4 GM/100ML-% IV SOLN
2.0000 g | INTRAVENOUS | Status: AC
  Administered 2018-06-26: 2 g via INTRAVENOUS
  Filled 2018-06-25 (×2): qty 100

## 2018-06-25 MED ORDER — BUPIVACAINE HCL (PF) 0.5 % IJ SOLN: 5.0000 mL | Freq: Once | INTRAMUSCULAR | Status: DC

## 2018-06-25 NOTE — Patient Instructions (Signed)
Your procedure is scheduled on: 06/26/2018 arrive at 5:30 am come in through the emergency room  Remember: Instructions that are not followed completely may result in serious medical risk, up to and including death, or upon the discretion of your surgeon and anesthesiologist your surgery may need to be rescheduled.    _x___ 1. Do not eat food after midnight the night before your procedure. You may drink clear liquids up to 2 hours before you are scheduled to arrive at the hospital for your procedure.  Do not drink clear liquids within 2 hours of your scheduled arrival to the hospital.  Clear liquids include  --Water or Apple juice without pulp  --Clear carbohydrate beverage such as ClearFast or Gatorade  --Black Coffee or Clear Tea (No milk, no creamers, do not add anything to                  the coffee or Tea Type 1 and type 2 diabetics should only drink water.   ____Ensure clear carbohydrate drink on the way to the hospital for bariatric patients  ____Ensure clear carbohydrate drink 3 hours before surgery for Dr Rutherford Nail patients if physician instructed.   No gum chewing or hard candies.     __x__ 2. No Alcohol for 24 hours before or after surgery.   __x__3. No Smoking or e-cigarettes for 24 prior to surgery.  Do not use any chewable tobacco products for at least 6 hour prior to surgery   ____  4. Bring all medications with you on the day of surgery if instructed.    __x__ 5. Notify your doctor if there is any change in your medical condition     (cold, fever, infections).    x___6. On the morning of surgery brush your teeth with toothpaste and water.  You may rinse your mouth with mouth wash if you wish.  Do not swallow any toothpaste or mouthwash.   Do not wear jewelry, make-up, hairpins, clips or nail polish.  Do not wear lotions, powders, or perfumes. You may wear deodorant.  Do not shave 48 hours prior to surgery. Men may shave face and neck.  Do not bring valuables to the  hospital.    [facility] is not responsible for any belongings or valuables.               Contacts, dentures or bridgework may not be worn into surgery.  Leave your suitcase in the car. After surgery it may be brought to your room.  For patients admitted to the hospital, discharge time is determined by your                       treatment team.  _  Patients discharged the day of surgery will not be allowed to drive home.  You will need someone to drive you home and stay with you the night of your procedure.    Please read over the following fact sheets that you were given:   [facility] Preparing for Surgery and or MRSA Information   _x___ Take anti-hypertensive listed below, cardiac, seizure, asthma,     anti-reflux and psychiatric medicines. These include:  1. levothyroxine (SYNTHROID, LEVOTHROID) 125 MCG tablet  2.  3.  4.  5.  6.  ____Fleets enema or Magnesium Citrate as directed.   _x___ Use CHG Soap or sage wipes as directed on instruction sheet   ____ Use inhalers on the day of surgery and bring to hospital  day of surgery  ____ Stop Metformin and Janumet 2 days prior to surgery.    ____ Take 1/2 of usual insulin dose the night before surgery and none on the morning     surgery.   _x___ Follow recommendations from Cardiologist, Pulmonologist or PCP regarding          stopping Aspirin, Coumadin, Plavix ,Eliquis, Effient, or Pradaxa, and Pletal.  X____Stop Anti-inflammatories such as Advil, Aleve, Ibuprofen, Motrin, Naproxen, Naprosyn, Goodies powders or aspirin products. OK to take Tylenol and                          Celebrex.   _x___ Stop supplements until after surgery.  But may continue Vitamin D, Vitamin B,       and multivitamin.   ____ Bring C-Pap to the hospital.

## 2018-06-26 ENCOUNTER — Encounter
Admission: RE | Disposition: A | Discharge status: Home / Self Care | Payer: Self-pay | Source: Home / Self Care | Attending: Obstetrics and Gynecology

## 2018-06-26 ENCOUNTER — Inpatient Hospital Stay: Admission: RE | Admit: 2018-06-26 | Payer: Medicaid Other | Source: Ambulatory Visit

## 2018-06-26 ENCOUNTER — Inpatient Hospital Stay: Payer: Medicaid Other | Admitting: Certified Registered Nurse Anesthetist

## 2018-06-26 ENCOUNTER — Other Ambulatory Visit: Payer: Self-pay

## 2018-06-26 ENCOUNTER — Inpatient Hospital Stay
Admission: RE | Admit: 2018-06-26 | Discharge: 2018-06-29 | DRG: 784 | Disposition: A | Discharge status: Home / Self Care | Payer: Medicaid Other | Attending: Obstetrics and Gynecology | Admitting: Obstetrics and Gynecology

## 2018-06-26 DIAGNOSIS — O99810 Abnormal glucose complicating pregnancy: Secondary | ICD-10-CM

## 2018-06-26 DIAGNOSIS — O26849 Uterine size-date discrepancy, unspecified trimester: Secondary | ICD-10-CM

## 2018-06-26 DIAGNOSIS — D62 Acute posthemorrhagic anemia: Secondary | ICD-10-CM | POA: Diagnosis not present

## 2018-06-26 DIAGNOSIS — O34211 Maternal care for low transverse scar from previous cesarean delivery: Principal | ICD-10-CM | POA: Diagnosis present

## 2018-06-26 DIAGNOSIS — Z98891 History of uterine scar from previous surgery: Secondary | ICD-10-CM

## 2018-06-26 DIAGNOSIS — O99284 Endocrine, nutritional and metabolic diseases complicating childbirth: Secondary | ICD-10-CM | POA: Diagnosis present

## 2018-06-26 DIAGNOSIS — Z3A37 37 weeks gestation of pregnancy: Secondary | ICD-10-CM

## 2018-06-26 DIAGNOSIS — O9081 Anemia of the puerperium: Secondary | ICD-10-CM | POA: Diagnosis not present

## 2018-06-26 DIAGNOSIS — O99213 Obesity complicating pregnancy, third trimester: Secondary | ICD-10-CM

## 2018-06-26 DIAGNOSIS — O365930 Maternal care for other known or suspected poor fetal growth, third trimester, not applicable or unspecified: Secondary | ICD-10-CM | POA: Diagnosis present

## 2018-06-26 DIAGNOSIS — O0990 Supervision of high risk pregnancy, unspecified, unspecified trimester: Secondary | ICD-10-CM

## 2018-06-26 DIAGNOSIS — O99210 Obesity complicating pregnancy, unspecified trimester: Secondary | ICD-10-CM | POA: Diagnosis present

## 2018-06-26 DIAGNOSIS — O365990 Maternal care for other known or suspected poor fetal growth, unspecified trimester, not applicable or unspecified: Secondary | ICD-10-CM | POA: Diagnosis present

## 2018-06-26 DIAGNOSIS — Z302 Encounter for sterilization: Secondary | ICD-10-CM | POA: Diagnosis not present

## 2018-06-26 DIAGNOSIS — O99280 Endocrine, nutritional and metabolic diseases complicating pregnancy, unspecified trimester: Secondary | ICD-10-CM

## 2018-06-26 DIAGNOSIS — E039 Hypothyroidism, unspecified: Secondary | ICD-10-CM | POA: Diagnosis present

## 2018-06-26 DIAGNOSIS — O34219 Maternal care for unspecified type scar from previous cesarean delivery: Secondary | ICD-10-CM

## 2018-06-26 DIAGNOSIS — Z6833 Body mass index (BMI) 33.0-33.9, adult: Secondary | ICD-10-CM

## 2018-06-26 DIAGNOSIS — O219 Vomiting of pregnancy, unspecified: Secondary | ICD-10-CM

## 2018-06-26 LAB — RPR: RPR Ser Ql: NONREACTIVE

## 2018-06-26 LAB — ABO/RH: ABO/RH(D): B POS

## 2018-06-26 SURGERY — Surgical Case
Anesthesia: Spinal

## 2018-06-26 MED ORDER — WITCH HAZEL-GLYCERIN EX PADS: 1.0000 "application " | MEDICATED_PAD | CUTANEOUS | Status: DC | PRN

## 2018-06-26 MED ORDER — BUPIVACAINE HCL (PF) 0.5 % IJ SOLN
INTRAMUSCULAR | Status: DC | PRN
  Administered 2018-06-26: 10 mL

## 2018-06-26 MED ORDER — NALOXONE HCL 0.4 MG/ML IJ SOLN: 0.4000 mg | INTRAMUSCULAR | Status: DC | PRN

## 2018-06-26 MED ORDER — SOD CITRATE-CITRIC ACID 500-334 MG/5ML PO SOLN
30.0000 mL | ORAL | Status: AC
  Administered 2018-06-26: 30 mL via ORAL

## 2018-06-26 MED ORDER — SODIUM CHLORIDE 0.9% FLUSH: 3.0000 mL | INTRAVENOUS | Status: DC | PRN

## 2018-06-26 MED ORDER — FAMOTIDINE 20 MG PO TABS
20.0000 mg | ORAL_TABLET | Freq: Once | ORAL | Status: AC
  Administered 2018-06-26: 20 mg via ORAL
  Filled 2018-06-26: qty 1

## 2018-06-26 MED ORDER — DEXAMETHASONE SODIUM PHOSPHATE 10 MG/ML IJ SOLN
INTRAMUSCULAR | Status: DC | PRN
  Administered 2018-06-26: 8 mg via INTRAVENOUS

## 2018-06-26 MED ORDER — LACTATED RINGERS IV SOLN
INTRAVENOUS | Status: DC
  Administered 2018-06-26: 07:00:00 via INTRAVENOUS

## 2018-06-26 MED ORDER — NALBUPHINE HCL 10 MG/ML IJ SOLN: 5.0000 mg | Freq: Once | INTRAMUSCULAR | Status: AC | PRN

## 2018-06-26 MED ORDER — LIDOCAINE HCL (PF) 1 % IJ SOLN
INTRAMUSCULAR | Status: AC
  Filled 2018-06-26: qty 30

## 2018-06-26 MED ORDER — COCONUT OIL OIL: 1.0000 "application " | TOPICAL_OIL | Status: DC | PRN

## 2018-06-26 MED ORDER — KETOROLAC TROMETHAMINE 30 MG/ML IJ SOLN
30.0000 mg | Freq: Four times a day (QID) | INTRAMUSCULAR | Status: AC
  Administered 2018-06-26 (×2): 30 mg via INTRAVENOUS
  Filled 2018-06-26 (×2): qty 1

## 2018-06-26 MED ORDER — NALBUPHINE HCL 10 MG/ML IJ SOLN
5.0000 mg | INTRAMUSCULAR | Status: DC | PRN
  Administered 2018-06-27: 5 mg via INTRAVENOUS
  Filled 2018-06-26: qty 1

## 2018-06-26 MED ORDER — LACTATED RINGERS IV SOLN
INTRAVENOUS | Status: DC
  Administered 2018-06-27: 02:00:00 via INTRAVENOUS

## 2018-06-26 MED ORDER — OXYTOCIN 10 UNIT/ML IJ SOLN
INTRAMUSCULAR | Status: AC
  Filled 2018-06-26: qty 2

## 2018-06-26 MED ORDER — ONDANSETRON HCL 4 MG/2ML IJ SOLN
INTRAMUSCULAR | Status: DC | PRN
  Administered 2018-06-26 (×2): 4 mg via INTRAVENOUS

## 2018-06-26 MED ORDER — ONDANSETRON HCL 4 MG/2ML IJ SOLN: 4.0000 mg | Freq: Three times a day (TID) | INTRAMUSCULAR | Status: DC | PRN

## 2018-06-26 MED ORDER — SODIUM CHLORIDE 0.9 % IV SOLN
INTRAVENOUS | Status: DC | PRN
  Administered 2018-06-26: 50 ug/min via INTRAVENOUS

## 2018-06-26 MED ORDER — PRENATAL MULTIVITAMIN CH
1.0000 | ORAL_TABLET | Freq: Every day | ORAL | Status: DC
  Administered 2018-06-27 – 2018-06-29 (×3): 1 via ORAL
  Filled 2018-06-26 (×3): qty 1

## 2018-06-26 MED ORDER — LACTATED RINGERS IV SOLN: INTRAVENOUS | Status: DC

## 2018-06-26 MED ORDER — MENTHOL 3 MG MT LOZG
1.0000 | LOZENGE | OROMUCOSAL | Status: DC | PRN
  Filled 2018-06-26: qty 9

## 2018-06-26 MED ORDER — OXYTOCIN 40 UNITS IN NORMAL SALINE INFUSION - SIMPLE MED
INTRAVENOUS | Status: AC
  Filled 2018-06-26: qty 1000

## 2018-06-26 MED ORDER — CARBOPROST TROMETHAMINE 250 MCG/ML IM SOLN
INTRAMUSCULAR | Status: AC
  Filled 2018-06-26: qty 1

## 2018-06-26 MED ORDER — KETOROLAC TROMETHAMINE 30 MG/ML IJ SOLN
30.0000 mg | Freq: Four times a day (QID) | INTRAMUSCULAR | Status: DC
  Administered 2018-06-26: 30 mg via INTRAVENOUS
  Filled 2018-06-26: qty 1

## 2018-06-26 MED ORDER — ONDANSETRON HCL 4 MG/2ML IJ SOLN
INTRAMUSCULAR | Status: AC
  Filled 2018-06-26: qty 2

## 2018-06-26 MED ORDER — FENTANYL CITRATE (PF) 100 MCG/2ML IJ SOLN
INTRAMUSCULAR | Status: DC | PRN
  Administered 2018-06-26: 15 ug via EPIDURAL

## 2018-06-26 MED ORDER — DIPHENHYDRAMINE HCL 25 MG PO CAPS: 25.0000 mg | ORAL_CAPSULE | Freq: Four times a day (QID) | ORAL | Status: DC | PRN

## 2018-06-26 MED ORDER — BUPIVACAINE IN DEXTROSE 0.75-8.25 % IT SOLN
INTRATHECAL | Status: DC | PRN
  Administered 2018-06-26: 1.6 mL via INTRATHECAL

## 2018-06-26 MED ORDER — KETOROLAC TROMETHAMINE 30 MG/ML IJ SOLN: 30.0000 mg | Freq: Four times a day (QID) | INTRAMUSCULAR | Status: AC

## 2018-06-26 MED ORDER — METHYLERGONOVINE MALEATE 0.2 MG/ML IJ SOLN
INTRAMUSCULAR | Status: AC
  Filled 2018-06-26: qty 1

## 2018-06-26 MED ORDER — DIBUCAINE 1 % RE OINT: 1.0000 "application " | TOPICAL_OINTMENT | RECTAL | Status: DC | PRN

## 2018-06-26 MED ORDER — OXYTOCIN 40 UNITS IN NORMAL SALINE INFUSION - SIMPLE MED
2.5000 [IU]/h | INTRAVENOUS | Status: AC
  Administered 2018-06-26: 2.5 [IU]/h via INTRAVENOUS
  Filled 2018-06-26: qty 1000

## 2018-06-26 MED ORDER — OXYCODONE-ACETAMINOPHEN 5-325 MG PO TABS: 2.0000 | ORAL_TABLET | ORAL | Status: DC | PRN

## 2018-06-26 MED ORDER — OXYCODONE-ACETAMINOPHEN 5-325 MG PO TABS
1.0000 | ORAL_TABLET | ORAL | Status: DC | PRN
  Administered 2018-06-27 – 2018-06-29 (×4): 1 via ORAL
  Filled 2018-06-26 (×6): qty 1

## 2018-06-26 MED ORDER — MISOPROSTOL 200 MCG PO TABS
ORAL_TABLET | ORAL | Status: AC
  Filled 2018-06-26: qty 4

## 2018-06-26 MED ORDER — AMMONIA AROMATIC IN INHA
RESPIRATORY_TRACT | Status: AC
  Filled 2018-06-26: qty 10

## 2018-06-26 MED ORDER — NALBUPHINE HCL 10 MG/ML IJ SOLN
5.0000 mg | Freq: Once | INTRAMUSCULAR | Status: AC | PRN
  Administered 2018-06-26: 5 mg via INTRAVENOUS

## 2018-06-26 MED ORDER — SOD CITRATE-CITRIC ACID 500-334 MG/5ML PO SOLN
ORAL | Status: AC
  Administered 2018-06-26: 30 mL via ORAL
  Filled 2018-06-26: qty 15

## 2018-06-26 MED ORDER — DIPHENHYDRAMINE HCL 50 MG/ML IJ SOLN
12.5000 mg | INTRAMUSCULAR | Status: DC | PRN
  Administered 2018-06-26: 12.5 mg via INTRAVENOUS
  Filled 2018-06-26: qty 1

## 2018-06-26 MED ORDER — OXYTOCIN 40 UNITS IN NORMAL SALINE INFUSION - SIMPLE MED
INTRAVENOUS | Status: DC | PRN
  Administered 2018-06-26: 700 mL via INTRAVENOUS

## 2018-06-26 MED ORDER — EPHEDRINE SULFATE 50 MG/ML IJ SOLN
INTRAMUSCULAR | Status: DC | PRN
  Administered 2018-06-26 (×3): 5 mg via INTRAVENOUS

## 2018-06-26 MED ORDER — SIMETHICONE 80 MG PO CHEW
80.0000 mg | CHEWABLE_TABLET | Freq: Three times a day (TID) | ORAL | Status: DC
  Administered 2018-06-26 – 2018-06-29 (×9): 80 mg via ORAL
  Filled 2018-06-26 (×9): qty 1

## 2018-06-26 MED ORDER — FENTANYL CITRATE (PF) 100 MCG/2ML IJ SOLN
INTRAMUSCULAR | Status: AC
  Filled 2018-06-26: qty 2

## 2018-06-26 MED ORDER — NALBUPHINE HCL 10 MG/ML IJ SOLN: 5.0000 mg | INTRAMUSCULAR | Status: DC | PRN

## 2018-06-26 MED ORDER — IBUPROFEN 600 MG PO TABS
600.0000 mg | ORAL_TABLET | Freq: Four times a day (QID) | ORAL | Status: DC
  Administered 2018-06-27 – 2018-06-29 (×9): 600 mg via ORAL
  Filled 2018-06-26 (×9): qty 1

## 2018-06-26 MED ORDER — SENNOSIDES-DOCUSATE SODIUM 8.6-50 MG PO TABS
2.0000 | ORAL_TABLET | ORAL | Status: DC
  Administered 2018-06-26 – 2018-06-28 (×3): 2 via ORAL
  Filled 2018-06-26 (×4): qty 2

## 2018-06-26 MED ORDER — OXYCODONE HCL 5 MG PO TABS
5.0000 mg | ORAL_TABLET | ORAL | Status: DC | PRN
  Filled 2018-06-26: qty 1

## 2018-06-26 MED ORDER — OXYCODONE HCL 5 MG PO TABS: 10.0000 mg | ORAL_TABLET | ORAL | Status: DC | PRN

## 2018-06-26 MED ORDER — DIPHENHYDRAMINE HCL 25 MG PO CAPS: 25.0000 mg | ORAL_CAPSULE | ORAL | Status: DC | PRN

## 2018-06-26 MED ORDER — LEVOTHYROXINE SODIUM 125 MCG PO TABS
125.0000 ug | ORAL_TABLET | Freq: Every day | ORAL | Status: DC
  Administered 2018-06-27 – 2018-06-29 (×3): 125 ug via ORAL
  Filled 2018-06-26 (×4): qty 1

## 2018-06-26 MED ORDER — MORPHINE SULFATE (PF) 0.5 MG/ML IJ SOLN
INTRAMUSCULAR | Status: AC
  Filled 2018-06-26: qty 10

## 2018-06-26 MED ORDER — NALBUPHINE HCL 10 MG/ML IJ SOLN
INTRAMUSCULAR | Status: AC
  Filled 2018-06-26: qty 1

## 2018-06-26 MED ORDER — ACETAMINOPHEN 325 MG PO TABS
650.0000 mg | ORAL_TABLET | Freq: Four times a day (QID) | ORAL | Status: AC
  Administered 2018-06-26 – 2018-06-27 (×4): 650 mg via ORAL
  Filled 2018-06-26 (×4): qty 2

## 2018-06-26 MED ORDER — FERROUS SULFATE 325 (65 FE) MG PO TABS
325.0000 mg | ORAL_TABLET | Freq: Two times a day (BID) | ORAL | Status: DC
  Administered 2018-06-26 – 2018-06-29 (×4): 325 mg via ORAL
  Filled 2018-06-26 (×6): qty 1

## 2018-06-26 MED ORDER — KETOROLAC TROMETHAMINE 30 MG/ML IJ SOLN: 30.0000 mg | Freq: Four times a day (QID) | INTRAMUSCULAR | Status: DC

## 2018-06-26 MED ORDER — MORPHINE SULFATE (PF) 0.5 MG/ML IJ SOLN
INTRAMUSCULAR | Status: DC | PRN
  Administered 2018-06-26: .1 mg via EPIDURAL

## 2018-06-26 SURGICAL SUPPLY — 36 items
CANISTER SUCT 3000ML PPV (MISCELLANEOUS) ×2 IMPLANT
CATH KIT ON-Q SILVERSOAK 5IN (CATHETERS) ×4 IMPLANT
CHLORAPREP W/TINT 26ML (MISCELLANEOUS) ×4 IMPLANT
COVER WAND RF STERILE (DRAPES) ×2 IMPLANT
CUP MEDICINE 2OZ PLAST GRAD ST (MISCELLANEOUS) ×2 IMPLANT
DERMABOND ADVANCED (GAUZE/BANDAGES/DRESSINGS) ×1
DERMABOND ADVANCED .7 DNX12 (GAUZE/BANDAGES/DRESSINGS) ×1 IMPLANT
DRESSING SURGICEL FIBRLLR 1X2 (HEMOSTASIS) ×1 IMPLANT
DRSG OPSITE POSTOP 4X10 (GAUZE/BANDAGES/DRESSINGS) ×2 IMPLANT
DRSG SURGICEL FIBRILLAR 1X2 (HEMOSTASIS) ×2
DRSG TELFA 3X8 NADH (GAUZE/BANDAGES/DRESSINGS) ×2 IMPLANT
ELECT REM PT RETURN 9FT ADLT (ELECTROSURGICAL) ×2
ELECTRODE REM PT RTRN 9FT ADLT (ELECTROSURGICAL) ×1 IMPLANT
GAUZE SPONGE 4X4 12PLY STRL (GAUZE/BANDAGES/DRESSINGS) ×2 IMPLANT
GLOVE BIO SURGEON STRL SZ8 (GLOVE) ×6 IMPLANT
GLOVE BIOGEL PI IND STRL 7.5 (GLOVE) ×3 IMPLANT
GLOVE BIOGEL PI INDICATOR 7.5 (GLOVE) ×3
GLOVE SKINSENSE NS SZ7.0 (GLOVE) ×3
GLOVE SKINSENSE STRL SZ7.0 (GLOVE) ×3 IMPLANT
GOWN STRL REUS W/ TWL LRG LVL3 (GOWN DISPOSABLE) ×2 IMPLANT
GOWN STRL REUS W/TWL LRG LVL3 (GOWN DISPOSABLE) ×2
NEEDLE HYPO 22GX1.5 SAFETY (NEEDLE) ×2 IMPLANT
NS IRRIG 1000ML POUR BTL (IV SOLUTION) ×2 IMPLANT
PACK C SECTION AR (MISCELLANEOUS) ×2 IMPLANT
PAD OB MATERNITY 4.3X12.25 (PERSONAL CARE ITEMS) ×4 IMPLANT
PAD PREP 24X41 OB/GYN DISP (PERSONAL CARE ITEMS) ×2 IMPLANT
STRIP CLOSURE SKIN 1/2X4 (GAUZE/BANDAGES/DRESSINGS) ×2 IMPLANT
SUT 2-0 PL GUT LIGAPAK (SUTURE) ×2 IMPLANT
SUT MNCRL AB 4-0 PS2 18 (SUTURE) ×2 IMPLANT
SUT PDS AB 1 TP1 96 (SUTURE) ×4 IMPLANT
SUT VIC AB 0 CT1 36 (SUTURE) ×8 IMPLANT
SUT VIC AB 1 CT1 36 (SUTURE) ×2 IMPLANT
SUT VIC AB 3-0 SH 27 (SUTURE) ×1
SUT VIC AB 3-0 SH 27X BRD (SUTURE) ×1 IMPLANT
SWABSTK COMLB BENZOIN TINCTURE (MISCELLANEOUS) ×2 IMPLANT
SYR 10ML LL (SYRINGE) ×2 IMPLANT

## 2018-06-26 NOTE — Anesthesia Preprocedure Evaluation (Signed)
Anesthesia Evaluation  Patient identified by MRN, date of birth, ID band Patient awake    Reviewed: Allergy & Precautions, NPO status , Patient's Chart, lab work & pertinent test results  History of Anesthesia Complications Negative for: history of anesthetic complications  Airway Mallampati: II  TM Distance: >3 FB Neck ROM: Full    Dental no notable dental hx.    Pulmonary neg pulmonary ROS, neg sleep apnea, neg COPD,    breath sounds clear to auscultation- rhonchi (-) wheezing      Cardiovascular Exercise Tolerance: Good (-) hypertension(-) CAD, (-) Past MI, (-) Cardiac Stents and (-) CABG  Rhythm:Regular Rate:Normal - Systolic murmurs and - Diastolic murmurs    Neuro/Psych neg Seizures negative neurological ROS  negative psych ROS   GI/Hepatic negative GI ROS, Neg liver ROS,   Endo/Other  neg diabetesHypothyroidism   Renal/GU negative Renal ROS     Musculoskeletal negative musculoskeletal ROS (+)   Abdominal (+) + obese, Gravid abdomen   Peds  Hematology negative hematology ROS (+)   Anesthesia Other Findings Past Medical History: No date: Hypothyroidism   Reproductive/Obstetrics (+) Pregnancy                             Lab Results  Component Value Date   WBC 8.6 06/25/2018   HGB 11.3 (L) 06/25/2018   HCT 35.2 (L) 06/25/2018   MCV 87.3 06/25/2018   PLT 232 06/25/2018    Anesthesia Physical Anesthesia Plan  ASA: II  Anesthesia Plan: Spinal   Post-op Pain Management:    Induction:   PONV Risk Score and Plan: 2 and Ondansetron  Airway Management Planned: Natural Airway  Additional Equipment:   Intra-op Plan:   Post-operative Plan:   Informed Consent: I have reviewed the patients History and Physical, chart, labs and discussed the procedure including the risks, benefits and alternatives for the proposed anesthesia with the patient or authorized representative who  has indicated his/her understanding and acceptance.     Dental advisory given  Plan Discussed with: CRNA and Anesthesiologist  Anesthesia Plan Comments:         Anesthesia Quick Evaluation

## 2018-06-26 NOTE — Progress Notes (Signed)
Called to inspect dressing which had some blood.  Stable from marked borders.  Dressing was taken down incision was inspected with no active bleeding.  Rolling a 4x4 across the incision no additional blood was noted to well up from below the incision.  Pressure dressing was applied.

## 2018-06-26 NOTE — Discharge Summary (Signed)
OB Discharge Summary     Patient Name: Ashley Hunter DOB: Dec 09, 1985 MRN: [id number]  Date of admission: 06/26/2018 Delivering MD: Thomasene Mohair, MD  Date of Delivery: 06/26/2018  Date of discharge: 06/29/2018  Admitting diagnosis: FETAL GROWTH RESTRICTION, HISTORY OF CESAREAN DELIVERY, DESIRES REPEAT, DESIRES PERMANENT STERILIZATION Intrauterine pregnancy: [redacted]w[redacted]d     Secondary diagnosis: None     Discharge diagnosis: Term Pregnancy Delivered                                                                                                Post partum procedures:none  Augmentation: n/a  Complications: None  Hospital course:  Sceduled C/S   33 y.o. yo G2P1001 at [redacted]w[redacted]d was admitted to the hospital 06/26/2018 for scheduled cesarean section with the following indication:Elective Repeat and fetal growth restriction and desire for permanent sterility.  Membrane Rupture Time/Date:   ,    Patient delivered a Viable infant.06/26/2018  Details of operation can be found in separate operative note.  Pateint had an uncomplicated postpartum course.  She is ambulating, tolerating a regular diet, passing flatus, and urinating well. Patient is discharged home in stable condition on  06/29/18         Physical exam  Vitals:   06/28/18 1542 06/28/18 2027 06/28/18 2305 06/29/18 0750  BP: 120/78 118/78 123/79 115/79  Pulse: 84 76 79 76  Resp: 20 20 20 18   Temp: 97.6 F (36.4 C) 97.9 F (36.6 C) 98.7 F (37.1 C) 97.6 F (36.4 C)  TempSrc:    Axillary  SpO2: 100% 100% 100% 100%  Weight:      Height:       General: alert, cooperative and no distress  CV: RRR Pulm: CTAB Lochia: appropriate Uterine Fundus: firm Incision: Healing well with no significant drainage, Dressing is clean, dry, and intact DVT Evaluation: No evidence of DVT seen on physical exam. No cords or calf tenderness. No significant calf/ankle edema.  Labs: Lab Results  Component Value Date   WBC 10.7 (H) 06/27/2018   HGB 8.8 (L)  06/27/2018   HCT 28.0 (L) 06/27/2018   MCV 87.0 06/27/2018   PLT 183 06/27/2018    Discharge instruction: per After Visit Summary.  Medications:  Allergies as of 06/29/2018   No Known Allergies     Medication List    STOP taking these medications   cetirizine 10 MG tablet Commonly known as:  ZyrTEC Allergy     TAKE these medications   Concept DHA 53.5-38-1 MG Caps Take 1 capsule by mouth daily. What changed:  when to take this   docusate sodium 100 MG capsule Commonly known as:  Colace Take 1 capsule (100 mg total) by mouth daily as needed.   ferrous sulfate 325 (65 FE) MG tablet Take 1 tablet (325 mg total) by mouth 2 (two) times daily with a meal.   fluticasone 50 MCG/ACT nasal spray Commonly known as:  Flonase Place 1 spray into both nostrils daily for 7 days.   ibuprofen 600 MG tablet Commonly known as:  ADVIL,MOTRIN Take 1 tablet (600 mg total) by mouth every 6 (  six) hours.   levothyroxine 125 MCG tablet Commonly known as:  SYNTHROID, LEVOTHROID Take 1 tablet (125 mcg total) by mouth daily before breakfast.   oxyCODONE-acetaminophen 5-325 MG tablet Commonly known as:  PERCOCET/ROXICET Take 1 tablet by mouth every 6 (six) hours as needed (breakthrough pain).            Discharge Care Instructions  (From admission, onward)         Start     Ordered   06/29/18 0000  Discharge wound care:    Comments:  Perform wound care instructions   06/29/18 0956          Diet: routine diet  Activity: Advance as tolerated. Pelvic rest for 6 weeks.   Outpatient follow up: Follow-up Information    Conard Novak, MD Follow up on 07/04/2018.   Specialty:  Obstetrics and Gynecology Why:  Keep previously scheduled apopintment Contact information: [address] [place] [address] [phone]        [facility], [place]. Schedule an appointment as soon as possible for a visit in 6 week(s).   Why:  Postpartum Appointment Contact  information: [address] [place] [address] [phone]             Postpartum contraception: Tubal Ligation Rhogam Given postpartum: no Rubella vaccine given postpartum: no Varicella vaccine given postpartum: no TDaP given antepartum or postpartum: 05/02/2018  Newborn Data: Live born female  Birth Weight: 5 lb 2 oz (2,325 g) APGAR: 9, 9  Newborn Delivery   Birth date/time:  06/26/2018 08:17:00 Delivery type:  C-Section, Low Transverse Trial of labor:  No C-section categorization:  Repeat     Baby Feeding: formula  Disposition:home with mother  SIGNED: Thomasene Mohair, MD, [facility], [facility] 06/29/2018 9:56 AM

## 2018-06-26 NOTE — Anesthesia Post-op Follow-up Note (Signed)
Anesthesia QCDR form completed.        

## 2018-06-26 NOTE — Anesthesia Procedure Notes (Signed)
Performed by: Amritha Yorke, CRNA Pre-anesthesia Checklist: Patient identified, Emergency Drugs available, Suction available, Patient being monitored and Timeout performed Oxygen Delivery Method: Nasal cannula       

## 2018-06-26 NOTE — Interval H&P Note (Signed)
History and Physical Interval Note:  06/26/2018 7:26 AM  Ashley Hunter  has presented today for surgery, with the diagnosis of FETAL GROWTH RESTRICTION, HISTORY OF CESAREAN DELIVERY, DESIRES REPEAT, DESIRES PERMANENT STERILIZATION.  The various methods of treatment have been discussed with the patient and family. After consideration of risks, benefits and other options for treatment, the patient has consented to  Procedure(s): CESAREAN SECTION WITH BILATERAL TUBAL LIGATION (N/A) as a surgical intervention .  The patient's history has been reviewed, patient examined, no change in status, stable for surgery.  I have reviewed the patient's chart and labs.  Questions were answered to the patient's satisfaction.  She confirmed that she would like a tubal ligation and understands there are non-permanent forms of contraception that work just as well as tubal ligation. Further, she understands that there is a failure rate of approximately 3-5/1,000 cases.    Thomasene Mohair, MD, [facility], [facility] 06/26/2018 7:27 AM

## 2018-06-26 NOTE — Transfer of Care (Signed)
Immediate Anesthesia Transfer of Care Note  Patient: Ashley Hunter  Procedure(s) Performed: CESAREAN SECTION WITH BILATERAL TUBAL LIGATION (N/A )  Patient Location: PACU  Anesthesia Type:Spinal  Level of Consciousness: awake, alert  and oriented  Airway & Oxygen Therapy: Patient Spontanous Breathing and Patient connected to nasal cannula oxygen  Post-op Assessment: Report given to RN and Post -op Vital signs reviewed and stable  Post vital signs: Reviewed and stable  Last Vitals:  Vitals Value Taken Time  BP    Temp    Pulse    Resp    SpO2      Last Pain:  Vitals:   06/26/18 0710  TempSrc:   PainSc: 0-No pain         Complications: No apparent anesthesia complications

## 2018-06-26 NOTE — Anesthesia Procedure Notes (Signed)
Spinal  Patient location during procedure: OR Start time: 06/26/2018 7:49 AM End time: 06/26/2018 7:54 AM Staffing Anesthesiologist: Emmie Niemann, MD Resident/CRNA: Demetrius Charity, CRNA Performed: resident/CRNA  Preanesthetic Checklist Completed: patient identified, site marked, surgical consent, pre-op evaluation, timeout performed, IV checked, risks and benefits discussed and monitors and equipment checked Spinal Block Patient position: sitting Prep: ChloraPrep Patient monitoring: heart rate, continuous pulse ox and blood pressure Approach: midline Location: L4-5 Injection technique: single-shot Needle Needle type: Introducer and Pencil-Tip  Needle gauge: 24 G Needle length: 9 cm Additional Notes Negative paresthesia. Negative blood return. Positive free-flowing CSF. Expiration date of kit checked and confirmed. Patient tolerated procedure well, without complications.

## 2018-06-26 NOTE — Op Note (Signed)
Ashley Hunter    Ashley Hunter   06/26/2018   Pre-operative Diagnosis:  1) Fetal growth restriction 2) History of Ashley delivery, desires repeat 3) Desires permanent sterilization 4) intrauterine pregnancy at [redacted]w[redacted]d   Post-operative Diagnosis:  1) Fetal growth restriction 2) History of Ashley delivery, desires repeat 3) Desires permanent sterilization 4) intrauterine pregnancy at [redacted]w[redacted]d   Procedure:  1) Repeat low transverse Ashley section via pfannenstiel incision 2) bilateral tubal ligation using Pomeroy method  Surgeon: Surgeon(s) and Role:    * Conard Novak, MD - Primary    Vena Austria, MD - Assisting   Assistants: Dr. Vena Austria; No other capable assistant available, in surgery requiring high level assistant.  Anesthesia: spinal   Findings:  1) normal appearing gravid uterus, fallopian tubes, and ovaries 2) viable female infant with weight 2,325 grams and APGARs 9 and 9   Quantified Blood Loss: 503 mL  Total IV Fluids: 1,400 ml crystalloid  Urine Output: 100 mL clear urine  Specimens: portion of right and left fallopian tube  Complications: no complications  Disposition: PACU - hemodynamically stable.   Maternal Condition: stable   Baby condition / location:  Couplet care / Skin to Skin  Procedure Details:  The patient was seen in the Holding Room. The risks, benefits, complications, treatment options, and expected outcomes were discussed with the patient. The patient concurred with the proposed plan, giving informed consent. identified as Ashley Hunter and the procedure verified as C-Section Delivery. A Time Out was held and the above information confirmed.   After induction of anesthesia, the patient was draped and prepped in the usual sterile manner. A Pfannenstiel incision was made and carried down through the subcutaneous tissue to the fascia. Fascial incision was made and extended transversely. The fascia was  separated from the underlying rectus tissue superiorly and inferiorly. The peritoneum was identified and entered. Peritoneal incision was extended longitudinally. The bladder flap was not bluntly or sharply freed from the lower uterine segment. A low transverse uterine incision was made and the hysterotomy was extended with cranial-caudal tension. Delivered from cephalic presentation was a 2,325 gram Living newborn infant(s) or Female with Apgar scores of 9 at one minute and 9 at five minutes. Cord ph was not sent the umbilical cord was clamped and cut cord blood was not obtained for evaluation. The placenta was removed Intact and appeared normal. The uterine outline, tubes and ovaries appeared normal apart from mild adhesions of right fallopian tube to ovary,which was gently taken down. . The uterine incision was closed with running locked sutures of 0 Vicryl.    The tubal ligation portion of the procedure was performed at this point.  The left fallopian tube was identified and followed out to the fimbriated end.  A Babcock clamp was used to grasp the tube in the mid-isthmic portion and two 2-0 plain gut sutures were used to ligate the tube.  An approximately 3cm segment of tube was removed with hemostasis assured.  The same procedure was performed on the right fallopian tube with hemostasis noted. On the right where the adhesions were taken down, Fibrillar was placed over the adhesion bed to assure continued hemostasis.    The uterus was returned to the abdomen and the paracolic gutters were cleared of all clots and debris.  The rectus muscles were inspected and found to be hemostatic.  The On-Q catheter pumps were inserted in accordance with the manufacturer's recommendations.  The catheters were inserted  approximately 4cm cephelad to the incision line, approximately 1cm apart, straddling the midline.  They were inserted to a depth of the 4th mark. They were positioned superficial to the rectus abdominus  muscles and deep to the rectus fascia.    The fascia was then reapproximated with running sutures of 1-0 PDS, looped. Three 3-0 vicryl stitches were thrown along the subcutaneous layer to reduce tension on the skin closure.  The subcuticular closure was performed using 4-0 monocryl. The skin closure was reinforced using benzoin and 1/2" steri-strips.  The On-Q catheters were bolused with 5 mL of 0.5% marcaine plain for a total of 10 mL.  The catheters were affixed to the skin with surgical skin glue, steri-strips, and tegaderm.    Instrument, sponge, and needle counts were correct prior the abdominal closure and were correct at the conclusion of the case.  The patient received Ancef 2 gram IV prior to skin incision (within 30 minutes). For VTE prophylaxis she was wearing SCDs throughout the case.  The assistant surgeon was an MD due to lack of availability of another Sales promotion account executive.    Signed: Conard Novak, MD 06/26/2018 9:06 AM

## 2018-06-27 LAB — SURGICAL PATHOLOGY

## 2018-06-27 LAB — CBC
HCT: 28 % — ABNORMAL LOW (ref 36.0–46.0)
Hemoglobin: 8.8 g/dL — ABNORMAL LOW (ref 12.0–15.0)
MCH: 27.3 pg (ref 26.0–34.0)
MCHC: 31.4 g/dL (ref 30.0–36.0)
MCV: 87 fL (ref 80.0–100.0)
Platelets: 183 10*3/uL (ref 150–400)
RBC: 3.22 MIL/uL — ABNORMAL LOW (ref 3.87–5.11)
RDW: 13.3 % (ref 11.5–15.5)
WBC: 10.7 10*3/uL — ABNORMAL HIGH (ref 4.0–10.5)
nRBC: 0 % (ref 0.0–0.2)

## 2018-06-27 NOTE — Progress Notes (Signed)
POD #1 Repeat CS and BTL Subjective:  Has been OOB this AM to void. Tolerating regular diet. Having more pain on the right side of her incision then on the left. Denies being lightheaded when OOB. Nurse reports that baby is having trouble sucking and swallowing. Patient not involved with baby's care, per [place] culture. Nurses have been feeding and caring for baby. OT has been consulted to evaluate baby's sucking and swallowing and will be teaching FOB to feed baby.   Objective:  Blood pressure 122/73, pulse 74, temperature 98 F (36.7 C), temperature source Oral, resp. rate 20, height 5\' 1"  (1.549 m), weight 84.8 kg, last menstrual period 10/05/2017, SpO2 99 %, unknown if currently breastfeeding.  General: NAD Heart: RRR without murmur Pulmonary: no increased work of breathing/ CTAB Abdomen: non-distended, mildly tender, fundus firm at level of umbilicus-1FB, Bowel sounds present Incision: Dressing C&D&I, ON Q intact Extremities: mild edema, no erythema, no tenderness  Results for orders placed or performed during the hospital encounter of 06/26/18 (from the past 72 hour(s))  ABO/Rh     Status: None   Collection Time: 06/26/18  7:33 AM  Result Value Ref Range   ABO/RH(D)      B POS Performed at [facility], [address] [place], [place] [address]   CBC     Status: Abnormal   Collection Time: 06/27/18  5:34 AM  Result Value Ref Range   WBC 10.7 (H) 4.0 - 10.5 K/uL   RBC 3.22 (L) 3.87 - 5.11 MIL/uL   Hemoglobin 8.8 (L) 12.0 - 15.0 g/dL   HCT 77.4 (L) 12.8 - 78.6 %   MCV 87.0 80.0 - 100.0 fL   MCH 27.3 26.0 - 34.0 pg   MCHC 31.4 30.0 - 36.0 g/dL   RDW 76.7 20.9 - 47.0 %   Platelets 183 150 - 400 K/uL   nRBC 0.0 0.0 - 0.2 %    Comment: Performed at [facility], [address] [place], [place] [address]     Assessment:   33 y.o. [phone] postoperativeday # 1-stable  Continue postoperative and postpartum care  Recommend taking deep breaths every 1-2  hours while awake and splinting incision  Encourage ambulation with assist  DC IV   Plan:  1) Chronic anemia worsened with acute blood loss- hemodynamically stable and asymptomatic - po iron and vitamins  2) B POS/ RI/ VI  3) TDAP given 05/02/2018   4) Bottle/Contraception-BTL  5) Disposition-anticipate discharge on POD #3  Farrel Conners, CNM

## 2018-06-27 NOTE — Anesthesia Postprocedure Evaluation (Signed)
Anesthesia Post Note  Patient: Ashley Hunter  Procedure(s) Performed: CESAREAN SECTION WITH BILATERAL TUBAL LIGATION (N/A )  Patient location during evaluation: Mother Baby Anesthesia Type: Spinal Level of consciousness: awake and alert and oriented Pain management: satisfactory to patient Vital Signs Assessment: post-procedure vital signs reviewed and stable Respiratory status: respiratory function stable Cardiovascular status: stable Postop Assessment: no headache, no backache, spinal receding, able to ambulate, adequate PO intake, no apparent nausea or vomiting and patient able to bend at knees Anesthetic complications: no     Last Vitals:  Vitals:   06/27/18 0500 06/27/18 0600  BP:    Pulse: 63 84  Resp:    Temp:    SpO2: 98% 98%    Last Pain:  Vitals:   06/27/18 0430  TempSrc: Oral  PainSc:                  Clydene Pugh

## 2018-06-27 NOTE — Anesthesia Post-op Follow-up Note (Signed)
  Anesthesia Pain Follow-up Note  Patient: Ashley Hunter  Day #: 1  Date of Follow-up: 06/27/2018 Time: 7:19 AM  Last Vitals:  Vitals:   06/27/18 0500 06/27/18 0600  BP:    Pulse: 63 84  Resp:    Temp:    SpO2: 98% 98%    Level of Consciousness: alert  Pain: mild   Side Effects:None  Catheter Site Exam:clean, dry     Plan: D/C from anesthesia care at surgeon's request  Clydene Pugh

## 2018-06-28 NOTE — Discharge Instructions (Signed)
Keep incision clean and dry.  Please follow instructions included in incision hygiene kit.  Call your doctor for incision concerns including redness, swelling, bleeding or drainage, or if begins to come apart.  No strenuous activity or heavy lifting for 6 weeks.  No intercourse, tampons, or douching for 6 weeks.  No tub baths- showers only.  No driving for 2 weeks or while taking pain medications.  Increase calories and fluids while breastfeeding.  Continue prenatal vitamin and iron.  Call your doctor for increased pain or vaginal bleeding, temperature above 100.4, depression, or concerns.

## 2018-06-28 NOTE — Plan of Care (Signed)
Alert and oriented with aprop. Affect. Color good, skin w&d. BBS clear. Fundus is Firm at U/-1 with small amount of Lochia. Denies c/o. Expressed concern regarding Infant's statis as Infant was transferred to [facility] today. Pt. States that she is coping, "well".

## 2018-06-28 NOTE — Lactation Note (Signed)
Lactation Consultation Note  Patient Name: JULIANNI SHUBA SUPJS'R Date: 06/28/2018   Mom has baby in [place].  Confirmed that mom did not intend to breast feed or pump for her baby.  Discussed differences between full breasts, engorgement and mastitis and how to dry up milk if she was sure about her decision.  Maternal Data    Feeding    LATCH Score                   Interventions    Lactation Tools Discussed/Used     Consult Status      Louis Meckel 06/28/2018, 2:02 PM

## 2018-06-29 MED ORDER — FERROUS SULFATE 325 (65 FE) MG PO TABS: 325.0000 mg | ORAL_TABLET | Freq: Two times a day (BID) | ORAL | 3 refills | Status: AC

## 2018-06-29 MED ORDER — DOCUSATE SODIUM 100 MG PO CAPS: 100.0000 mg | ORAL_CAPSULE | Freq: Every day | ORAL | 2 refills | Status: AC | PRN

## 2018-06-29 MED ORDER — OXYCODONE-ACETAMINOPHEN 5-325 MG PO TABS: 1.0000 | ORAL_TABLET | Freq: Four times a day (QID) | ORAL | 0 refills | Status: AC | PRN

## 2018-06-29 MED ORDER — IBUPROFEN 600 MG PO TABS: 600.0000 mg | ORAL_TABLET | Freq: Four times a day (QID) | ORAL | 0 refills | Status: AC

## 2018-06-29 NOTE — Clinical Social Work Maternal (Signed)
  CLINICAL SOCIAL WORK MATERNAL/CHILD NOTE  Patient Details  Name: Ashley Hunter MRN: [id number] Date of Birth: 1985-08-17  Date:  06/29/2018  Clinical Social Worker Initiating Note:  Ashley Hunter, MSW, LCSW Date/Time: Initiated:  06/29/18/1127     Child's Name:      Biological Parents:  Mother, Father   Need for Interpreter:  None   Reason for Referral:  Other (Comment)(NICU admission, transfer to [facility], [place] score of 9)   Address:  [place] [address]    Phone number:  [phone] (home)     Additional phone number: None  Household Members/Support Persons (HM/SP):   Household Member/Support Person 1   HM/SP Name Relationship DOB or Age  HM/SP -1 Ashley Hunter Spouse 30  HM/SP -2        HM/SP -3        HM/SP -4        HM/SP -5        HM/SP -6        HM/SP -7        HM/SP -8          Natural Supports (not living in the home):  Community, Extended Family, Friends, Immediate Family, Education administrator, Artist Supports:     Employment: Agricultural engineer   Type of Work: None   Education:  High school graduate   Homebound arranged:    Museum/gallery curator Resources:  Medicaid   Other Resources:  [facility]   Cultural/Religious Considerations Which May Impact Care:  Patient is of [place] descent; grief reactions and anxiety/depressive symptoms may present differently  Strengths:  Ability to meet basic needs , Compliance with medical plan , Home prepared for child , Understanding of illness   Psychotropic Medications:         Pediatrician:       Pediatrician List:   [facility]      Pediatrician Fax Number:    Risk Factors/Current Problems:  Other (Comment)(Possible risk of postpartum depression/anxiety based on [place] and NICU admission for infant)   Cognitive State:  Linear Thinking , Able to Concentrate , Alert    Mood/Affect:  Blunted     CSW Assessment: The CSW met with the patient at bedside to introduce self and role in care. The CSW explained the [place] Scale and the patient's score (9) as on the cusp of risk for depression. The CSW discussed the client's risk of postpartum depression and/or anxiety as related to having her child not only admitted to NICU, but also transferred to [facility]. The patient shared that she is feeling worried but confident in the medical care that her child has received and will receive. The patient also shared that she feels well supported by her spouse and family, and she is looking forward to having her child return home when stable. The patient indicated no further questions, and she declined [employer].  The patient seems to be appropriate in her reaction to her child's medical barriers, and she also seems hopeful about the situation. The client would benefit from ongoing support from the social work staff at [employer]. The CSW is signing off. Please consult should needs arise.  CSW Plan/Description:  No Further Intervention Required/No Barriers to Discharge    Ashley Pho, LCSW 06/29/2018, 11:29 AM

## 2018-06-29 NOTE — Progress Notes (Signed)
Discharge instructions provided.  Interpreter offered, but patient declined.  Pt and sig other verbalize understanding of all instructions and follow-up care.  Incision hygiene kit given.  Pt discharged to home at 1148 on 06/29/18 via wheelchair by RN. Reed Breech, RN 06/29/2018 12:43 PM

## 2018-06-29 NOTE — Progress Notes (Signed)
Patient states that she received TDaP and Influenza vaccines during pregnancy.

## 2018-07-04 ENCOUNTER — Other Ambulatory Visit: Payer: Self-pay

## 2018-07-04 ENCOUNTER — Encounter: Payer: Self-pay | Admitting: Obstetrics and Gynecology

## 2018-07-04 ENCOUNTER — Ambulatory Visit (INDEPENDENT_AMBULATORY_CARE_PROVIDER_SITE_OTHER): Payer: Medicaid Other | Admitting: Obstetrics and Gynecology

## 2018-07-04 VITALS — BP 122/78 | Wt 175.0 lb

## 2018-07-04 DIAGNOSIS — Z09 Encounter for follow-up examination after completed treatment for conditions other than malignant neoplasm: Secondary | ICD-10-CM

## 2018-07-04 NOTE — Progress Notes (Signed)
   Postoperative Follow-up Patient presents post op from cesarean section with tubal ligation  1 weeks ago.  Subjective: She denies fever, chills, nausea and vomiting. Eating a regular diet without difficulty. The patient is not having any pain.  Activity: increasing slowly. She denies issues with her incision.    Objective: BP 122/78   Wt 175 lb (79.4 kg)   BMI 33.07 kg/m   Constitutional: Well nourished, well developed female in no acute distress.  HEENT: normal Skin: Warm and dry.  Abdomen: s, nt, nd, +BS, uterus at U -4 clean, dry, intact and without erythema, induration, warmth, and tenderness Extremity: no edema   Assessment: 33 y.o. s/p cesarean section with tubal ligation progressing well  Plan: Patient has done well after surgery with no apparent complications.  I have discussed the post-operative course to date, and the expected progress moving forward.  The patient understands what complications to be concerned about.    Activity plan: No restriction.  Return in about 5 weeks (around 08/08/2018) for Six Week Postpartum.  Thomasene Mohair, MD 07/04/2018 11:37 AM

## 2018-08-11 ENCOUNTER — Ambulatory Visit: Payer: Medicaid Other | Admitting: Obstetrics and Gynecology

## 2018-08-15 ENCOUNTER — Ambulatory Visit: Payer: Medicaid Other | Admitting: Obstetrics and Gynecology

## 2018-12-23 IMAGING — US US OB LIMITED
1 series · 14 of 20 positions shown · non-contrast
Comparison: none

CLINICAL DATA: Oligohydramnios. Current assigned gestational age of
35 weeks 6 days.

EXAM:
LIMITED OBSTETRIC ULTRASOUND

[Series 1: us ob limited · 0.23mm/px · 14 of 20 slices shown]
[im 1/20]
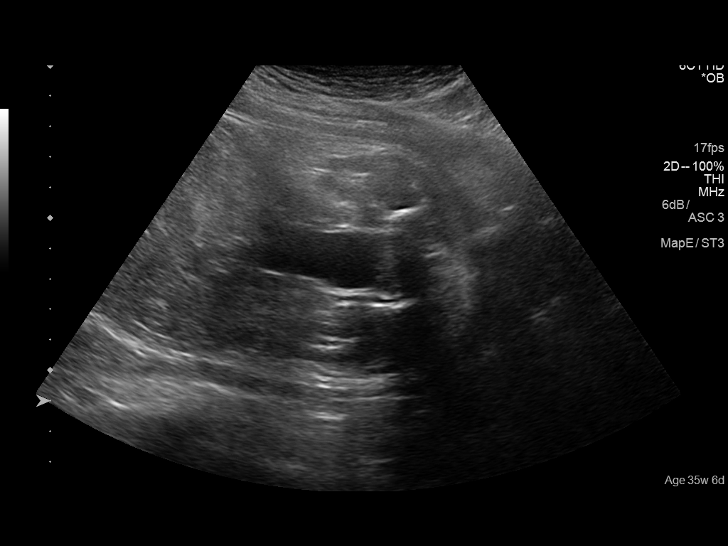
[im 3/20]
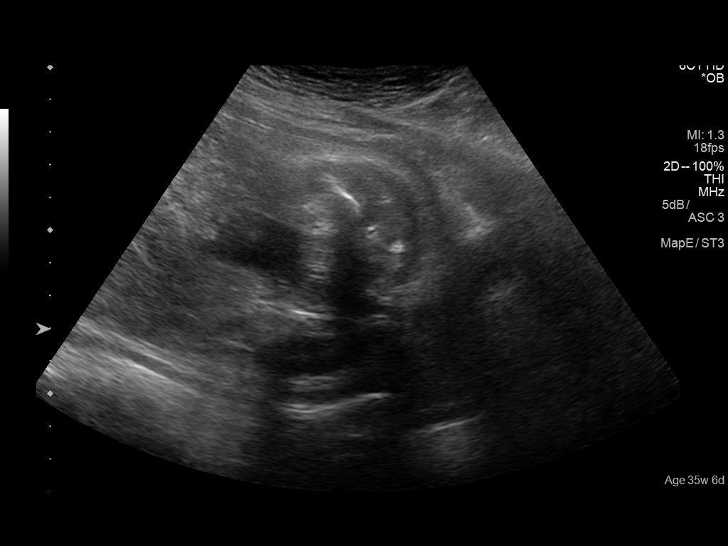
[im 4/20]
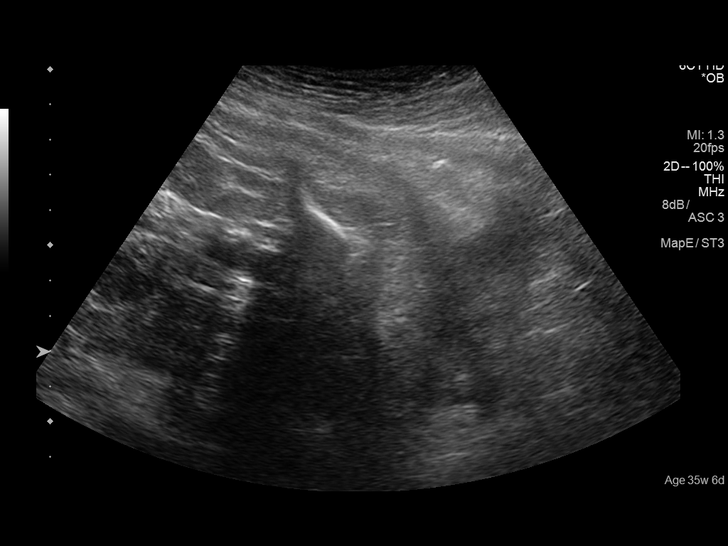
[im 6/20]
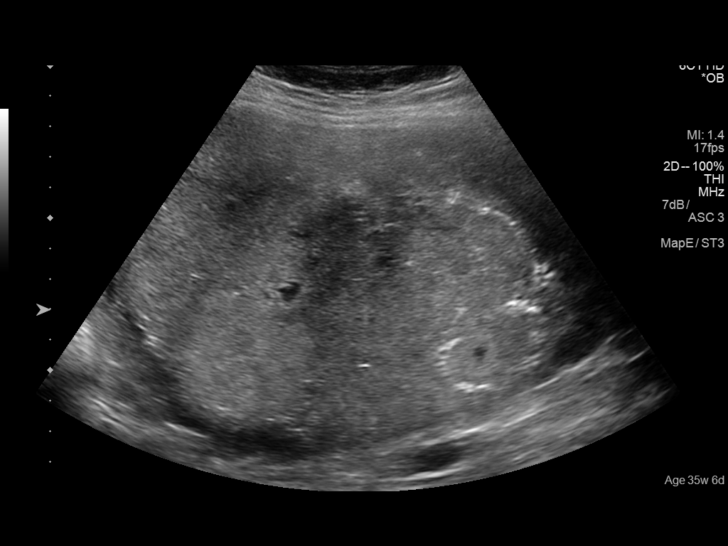
[im 7/20]
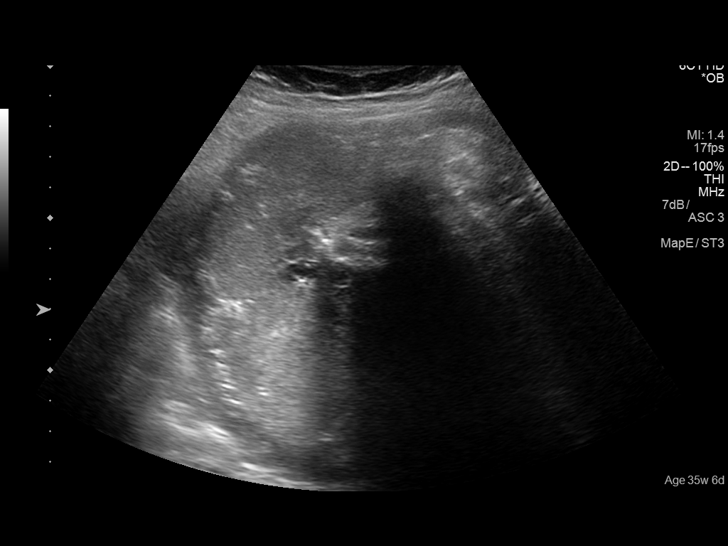
[im 8/20]
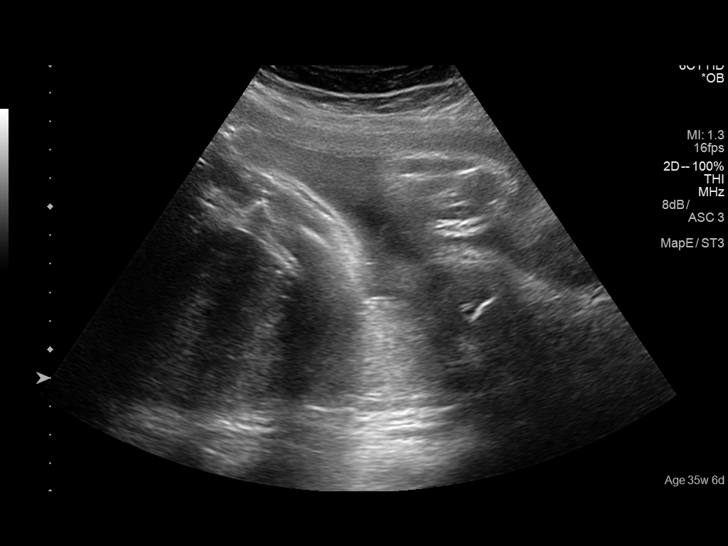
[im 10/20]
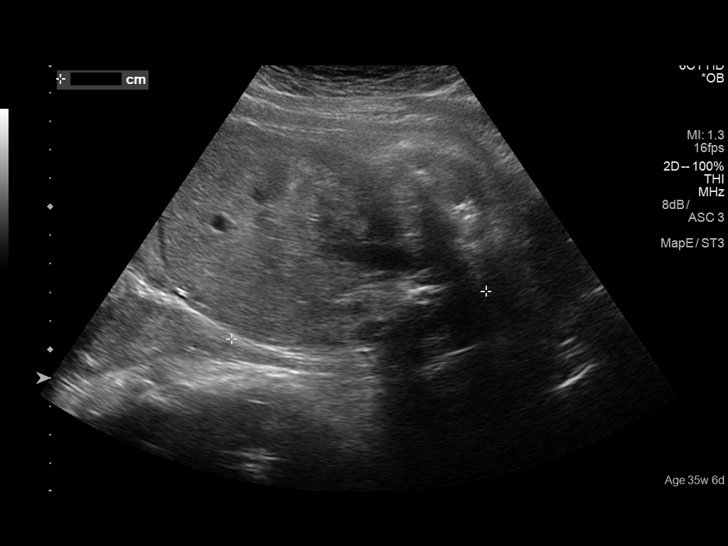
[im 11/20]
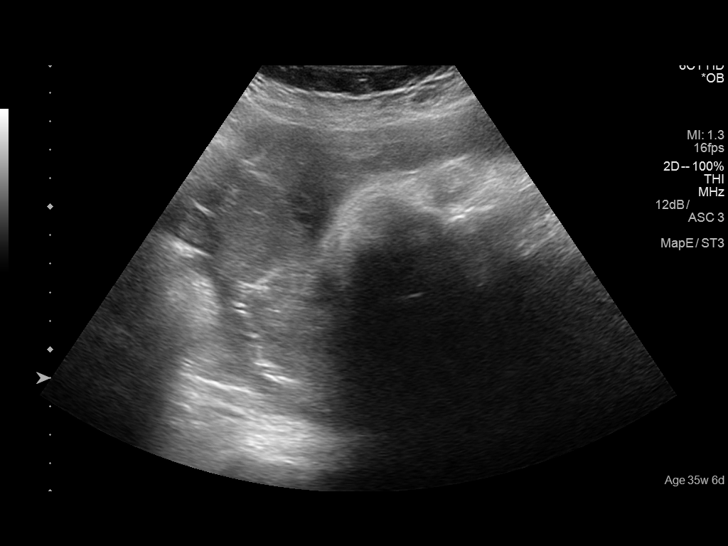
[im 13/20]
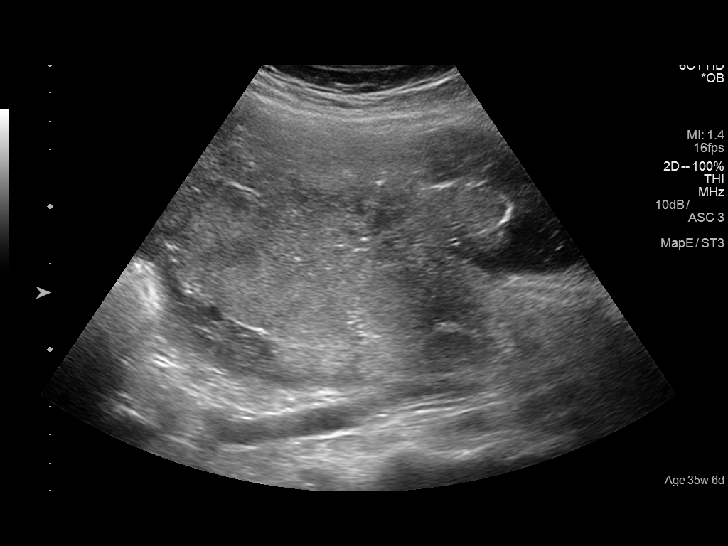
[im 14/20]
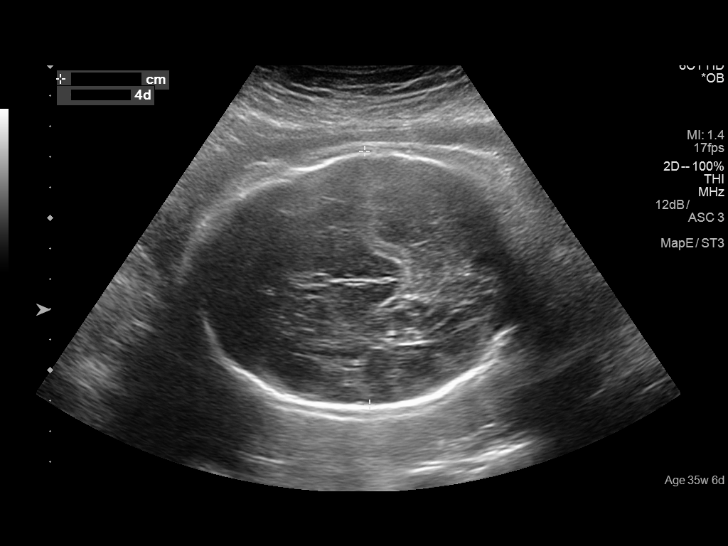
[im 16/20]
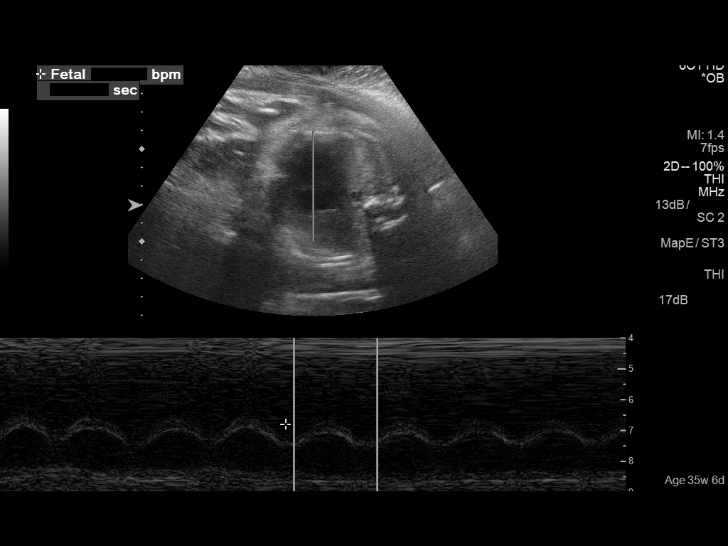
[im 17/20]
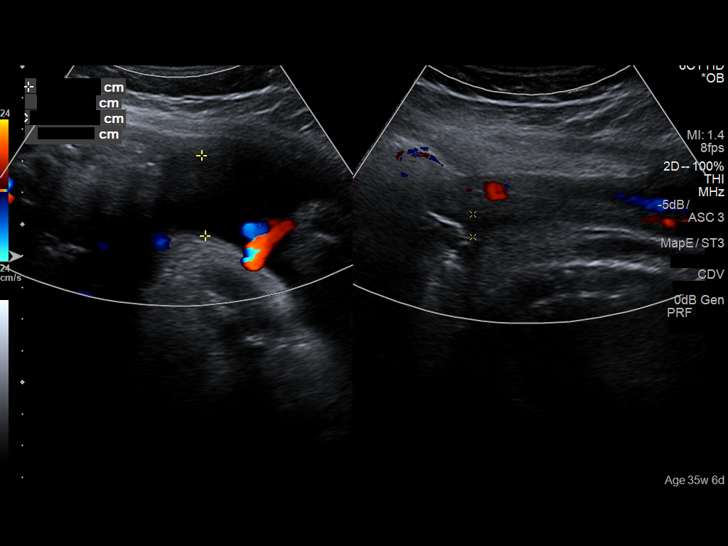
[im 18/20]
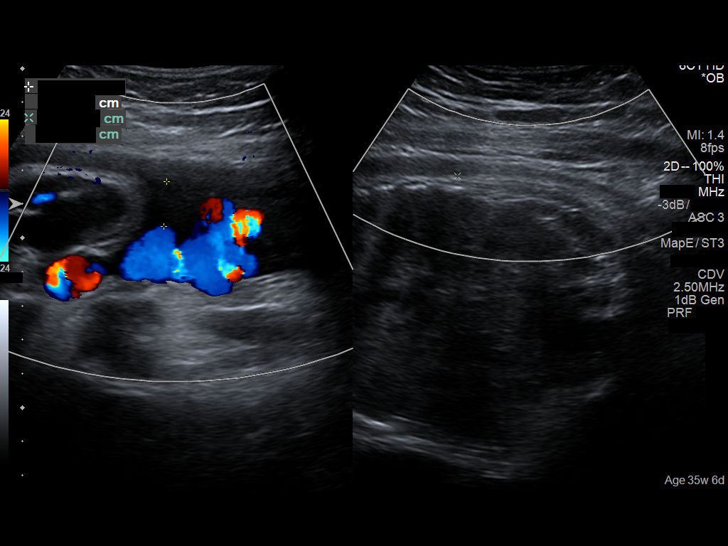
[im 20/20]
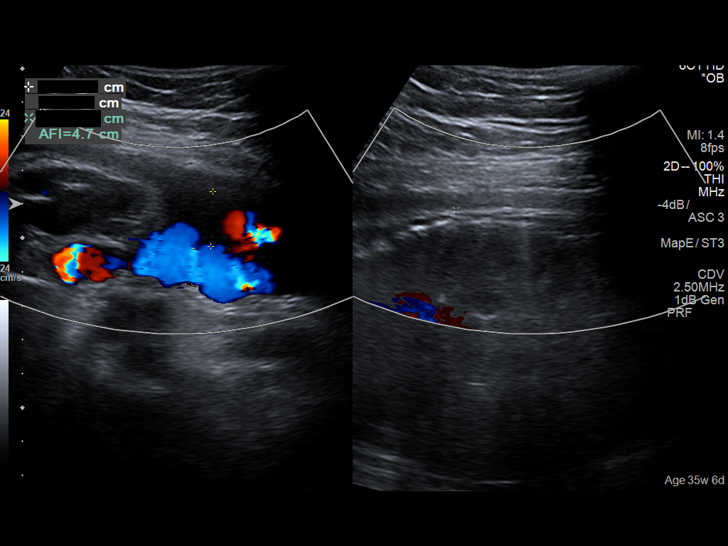

[14 of 20 positions shown; findings below may reference images not displayed]

FINDINGS: Number of Fetuses: 1

Heart Rate:  123 bpm

Movement: Yes

Presentation: Breech

Placental Location: Fundal and posterior

Previa: No

Amniotic Fluid (Subjective):  Subjectively decreased

Deepest vertical pocket:  2.1 cm

AFI:  4.7 cm

BPD:  8.4cm 33w  5d

MATERNAL FINDINGS:

Cervix:  Appears closed.

Uterus/Adnexae:  No abnormality visualized.
IMPRESSION: Single living intrauterine fetus in breech presentation.

Subjectively decreased amniotic fluid volume, with AFI of 4.7 cm.

This exam does not comprehensively evaluate fetal size, dating, or
anatomy; follow-up complete OB US should be considered if further
fetal assessment is warranted.

## 2019-06-25 ENCOUNTER — Emergency Department
Admission: EM | Admit: 2019-06-25 | Discharge: 2019-06-25 | Disposition: A | Discharge status: Home / Self Care | Payer: Medicaid Other | Attending: Emergency Medicine | Admitting: Emergency Medicine

## 2019-06-25 ENCOUNTER — Other Ambulatory Visit: Payer: Self-pay

## 2019-06-25 DIAGNOSIS — J0100 Acute maxillary sinusitis, unspecified: Secondary | ICD-10-CM | POA: Diagnosis not present

## 2019-06-25 DIAGNOSIS — Z79899 Other long term (current) drug therapy: Secondary | ICD-10-CM | POA: Diagnosis not present

## 2019-06-25 DIAGNOSIS — H6691 Otitis media, unspecified, right ear: Secondary | ICD-10-CM | POA: Insufficient documentation

## 2019-06-25 DIAGNOSIS — R0981 Nasal congestion: Secondary | ICD-10-CM | POA: Insufficient documentation

## 2019-06-25 DIAGNOSIS — R070 Pain in throat: Secondary | ICD-10-CM | POA: Insufficient documentation

## 2019-06-25 DIAGNOSIS — E039 Hypothyroidism, unspecified: Secondary | ICD-10-CM | POA: Diagnosis not present

## 2019-06-25 DIAGNOSIS — H9201 Otalgia, right ear: Secondary | ICD-10-CM | POA: Diagnosis present

## 2019-06-25 MED ORDER — AMOXICILLIN-POT CLAVULANATE 875-125 MG PO TABS: 1.0000 | ORAL_TABLET | Freq: Two times a day (BID) | ORAL | 0 refills | Status: AC

## 2019-06-25 MED ORDER — FLUTICASONE PROPIONATE 50 MCG/ACT NA SUSP: 2.0000 | Freq: Every day | NASAL | 0 refills | Status: AC

## 2019-06-25 NOTE — ED Triage Notes (Signed)
Pt arrives to ED for R ear pain. A&O, ambulatory.

## 2019-06-25 NOTE — ED Provider Notes (Signed)
[facility] Emergency Department Provider Note  ____________________________________________  Time seen: Approximately 12:49 PM  I have reviewed the triage vital signs and the nursing notes.   HISTORY  Chief Complaint Otalgia    HPI Ashley Hunter is a 34 y.o. female that presents to the emergency department for evaluation of right ear pain intermittent for 1 week and nasal congestion and a sore throat for 4 days.  Patient feels drainage down the back of her throat and thinks this may be causing her pain.  Patient states that she has had intermittent chronic pain to her right ear.  No fever, headache, cough, shortness of breath, vomiting.  Past Medical History:  Diagnosis Date  . Hypothyroidism     Patient Active Problem List   Diagnosis Date Noted  . History of cesarean delivery 06/26/2018  . Antepartum fetal growth restriction 04/28/2018  . Abnormal glucose tolerance in mother complicating pregnancy 04/17/2018  . Pregnancy affected by fetal growth restriction 04/14/2018  . minimal left fetal renal pelviectasis  03/13/2018  . Fetal size inconsistent with dates 02/25/2018  . Hypothyroid in pregnancy, antepartum 02/25/2018  . Nausea and vomiting during pregnancy 02/25/2018  . BMI 33.0-33.9,adult 12/06/2017  . History of cesarean delivery, currently pregnant 11/21/2017  . Supervision of high risk pregnancy, antepartum 11/21/2017  . Obesity affecting pregnancy 11/21/2017    Past Surgical History:  Procedure Laterality Date  . APPENDECTOMY    . CESAREAN SECTION N/A 04/27/2016   Procedure: CESAREAN SECTION FOR BREECH PRESENTATION;  Surgeon: Conard Novak, MD;  Location: [facility] ORS;  Service: Obstetrics;  Laterality: N/A;  Baby girl born @ 1300 Apgars: 8/9 Weight: 5lbs 9ozs  . CESAREAN SECTION WITH BILATERAL TUBAL LIGATION N/A 06/26/2018   Procedure: CESAREAN SECTION WITH BILATERAL TUBAL LIGATION;  Surgeon: Conard Novak, MD;  Location: [facility] ORS;   Service: Obstetrics;  Laterality: N/A;    Prior to Admission medications   Medication Sig Start Date End Date Taking? Authorizing Provider  amoxicillin-clavulanate (AUGMENTIN) 875-125 MG tablet Take 1 tablet by mouth 2 (two) times daily for 10 days. 06/25/19 07/05/19  Enid Derry, PA-C  docusate sodium (COLACE) 100 MG capsule Take 1 capsule (100 mg total) by mouth daily as needed. 06/29/18 06/29/19  Conard Novak, MD  ferrous sulfate 325 (65 FE) MG tablet Take 1 tablet (325 mg total) by mouth 2 (two) times daily with a meal. 06/29/18   Conard Novak, MD  fluticasone [facility]) 50 MCG/ACT nasal spray Place 2 sprays into both nostrils daily. 06/25/19 06/24/20  Enid Derry, PA-C  ibuprofen (ADVIL,MOTRIN) 600 MG tablet Take 1 tablet (600 mg total) by mouth every 6 (six) hours. 06/29/18   Conard Novak, MD  levothyroxine (SYNTHROID, LEVOTHROID) 125 MCG tablet Take 1 tablet (125 mcg total) by mouth daily before breakfast. 05/02/18   Conard Novak, MD  oxyCODONE-acetaminophen (PERCOCET/ROXICET) 5-325 MG tablet Take 1 tablet by mouth every 6 (six) hours as needed (breakthrough pain). 06/29/18   Conard Novak, MD  Prenat-FeFum-FePo-FA-Omega 3 (CONCEPT DHA) 53.5-38-1 MG CAPS Take 1 capsule by mouth daily. Patient taking differently: Take 1 capsule by mouth at bedtime.  05/29/18   Conard Novak, MD    Allergies Patient has no known allergies.  Family History  Problem Relation Age of Onset  . Diabetes Paternal Grandfather     Social History Social History   Tobacco Use  . Smoking status: Never Smoker  . Smokeless tobacco: Never Used  Substance Use Topics  .  Alcohol use: No  . Drug use: No     Review of Systems  Constitutional: No fever/chills Eyes: No visual changes. No discharge. ENT: Positive for congestion and rhinorrhea.  Positive for sore throat.  Positive for ear pain. Cardiovascular: No chest pain. Respiratory: Positive for cough. No SOB. Gastrointestinal: No  abdominal pain.  No nausea, no vomiting.  No diarrhea.  No constipation. Musculoskeletal: Negative for musculoskeletal pain. Skin: Negative for rash, abrasions, lacerations, ecchymosis. Neurological: Negative for headaches.   ____________________________________________   PHYSICAL EXAM:  VITAL SIGNS: ED Triage Vitals  Enc Vitals Group     BP 06/25/19 1226 112/77     Pulse Rate 06/25/19 1226 88     Resp 06/25/19 1226 16     Temp 06/25/19 1226 97.7 F (36.5 C)     Temp Source 06/25/19 1226 Oral     SpO2 06/25/19 1226 98 %     Weight 06/25/19 1224 188 lb (85.3 kg)     Height 06/25/19 1224 5\' 1"  (1.549 m)     Head Circumference --      Peak Flow --      Pain Score 06/25/19 1224 8     Pain Loc --      Pain Edu? --      Excl. in [place]? --      Constitutional: Alert and oriented. Well appearing and in no acute distress. Eyes: Conjunctivae are normal. PERRL. EOMI. No discharge. Head: Atraumatic. ENT: Maxillary sinus tenderness.      Ears: Right tympanic membrane erythematous with effusion.  Left tympanic membrane is pearly.      Nose: Mild congestion/rhinnorhea.      Mouth/Throat: Mucous membranes are moist. Oropharynx non-erythematous. Tonsils not enlarged. No exudates. Uvula midline. Neck: No stridor.   Hematological/Lymphatic/Immunilogical: No cervical lymphadenopathy. Cardiovascular: Normal rate, regular rhythm.  Good peripheral circulation. Respiratory: Normal respiratory effort without tachypnea or retractions. Lungs CTAB. Good air entry to the bases with no decreased or absent breath sounds. Musculoskeletal: Full range of motion to all extremities. No gross deformities appreciated. Neurologic:  Normal speech and language. No gross focal neurologic deficits are appreciated.  Skin:  Skin is warm, dry and intact. No rash noted. Psychiatric: Mood and affect are normal. Speech and behavior are normal. Patient exhibits appropriate insight and  judgement.   ____________________________________________   LABS (all labs ordered are listed, but only abnormal results are displayed)  Labs Reviewed - No data to display ____________________________________________  EKG   ____________________________________________  RADIOLOGY  No results found.  ____________________________________________    PROCEDURES  Procedure(s) performed:    Procedures    Medications - No data to display   ____________________________________________   INITIAL IMPRESSION / ASSESSMENT AND PLAN / ED COURSE  Pertinent labs & imaging results that were available during my care of the patient were reviewed by me and considered in my medical decision making (see chart for details).  Review of the [place] CSRS was performed in accordance of the [place] prior to dispensing any controlled drugs.     Patient's diagnosis is consistent with sinusitis and otitis.  Vital signs and exam are reassuring. Patient appears well and is staying well hydrated. Patient feels comfortable going home. Patient will be discharged home with prescriptions for Augmentin and Flonase. Patient is to follow up with ENT as needed or otherwise directed.  Referral was given.  Patient is given ED precautions to return to the ED for any worsening or new symptoms.   Luvenia Heller was  evaluated in Emergency Department on 06/25/2019 for the symptoms described in the history of present illness. She was evaluated in the context of the global COVID-19 pandemic, which necessitated consideration that the patient might be at risk for infection with the SARS-CoV-2 virus that causes COVID-19. Institutional protocols and algorithms that pertain to the evaluation of patients at risk for COVID-19 are in a state of rapid change based on information released by regulatory bodies including the CDC and federal and state organizations. These policies and algorithms were followed during the patient's care in  the ED.  ____________________________________________  FINAL CLINICAL IMPRESSION(S) / ED DIAGNOSES  Final diagnoses:  Otitis of right ear  Acute maxillary sinusitis, recurrence not specified      NEW MEDICATIONS STARTED DURING THIS VISIT:  ED Discharge Orders         Ordered    amoxicillin-clavulanate (AUGMENTIN) 875-125 MG tablet  2 times daily     06/25/19 1310    fluticasone (FLONASE) 50 MCG/ACT nasal spray  Daily     06/25/19 1310              This chart was dictated using voice recognition software/Dragon. Despite best efforts to proofread, errors can occur which can change the meaning. Any change was purely unintentional.    Enid Derry, PA-C 06/25/19 1325    Jene Every, MD 06/25/19 1409

## 2019-06-28 ENCOUNTER — Ambulatory Visit: Payer: Medicaid Other | Attending: Internal Medicine

## 2019-06-28 DIAGNOSIS — Z23 Encounter for immunization: Secondary | ICD-10-CM | POA: Insufficient documentation

## 2019-06-28 NOTE — Progress Notes (Signed)
   Covid-19 Vaccination Clinic  Name:  Ashley Hunter    MRN: [id number] DOB: 1985/08/13  06/28/2019  Ashley Hunter was observed post Covid-19 immunization for 15 minutes without incident. She was provided with Vaccine Information Sheet and instruction to access the V-Safe system.   Ashley Hunter was instructed to call 911 with any severe reactions post vaccine: Marland Kitchen Difficulty breathing  . Swelling of face and throat  . A fast heartbeat  . A bad rash all over body  . Dizziness and weakness   Immunizations Administered    Name Date Dose VIS Date Route   Pfizer COVID-19 Vaccine 06/28/2019  2:27 PM 0.3 mL 04/03/2019 Intramuscular   Manufacturer: [employer], [employer]   Lot: [id number]   NDC: [address]-4

## 2019-07-21 ENCOUNTER — Ambulatory Visit: Payer: Medicaid Other

## 2019-07-22 ENCOUNTER — Ambulatory Visit: Payer: BC Managed Care – PPO | Attending: Internal Medicine

## 2019-07-22 DIAGNOSIS — Z23 Encounter for immunization: Secondary | ICD-10-CM

## 2019-07-22 NOTE — Progress Notes (Signed)
   Covid-19 Vaccination Clinic  Name:  Ashley Hunter    MRN: [id number] DOB: [birth year]  07/22/2019  Ashley Hunter was observed post Covid-19 immunization for 15 minutes without incident. She was provided with Vaccine Information Sheet and instruction to access the V-Safe system.   Ashley Hunter was instructed to call 911 with any severe reactions post vaccine: Marland Kitchen Difficulty breathing  . Swelling of face and throat  . A fast heartbeat  . A bad rash all over body  . Dizziness and weakness   Immunizations Administered    Name Date Dose VIS Date Route   Pfizer COVID-19 Vaccine 07/22/2019  1:39 PM 0.3 mL 04/03/2019 Intramuscular   Manufacturer: [employer], [employer]   Lot: [id number]   NDC: [address]-5

## 2019-10-22 DIAGNOSIS — Z419 Encounter for procedure for purposes other than remedying health state, unspecified: Secondary | ICD-10-CM | POA: Diagnosis not present

## 2019-11-11 DIAGNOSIS — D649 Anemia, unspecified: Secondary | ICD-10-CM | POA: Diagnosis not present

## 2019-11-11 DIAGNOSIS — E039 Hypothyroidism, unspecified: Secondary | ICD-10-CM | POA: Diagnosis not present

## 2019-11-11 DIAGNOSIS — H9201 Otalgia, right ear: Secondary | ICD-10-CM | POA: Diagnosis not present

## 2019-11-11 DIAGNOSIS — E538 Deficiency of other specified B group vitamins: Secondary | ICD-10-CM | POA: Diagnosis not present

## 2019-11-11 DIAGNOSIS — R7309 Other abnormal glucose: Secondary | ICD-10-CM | POA: Diagnosis not present

## 2019-11-11 DIAGNOSIS — Z6831 Body mass index (BMI) 31.0-31.9, adult: Secondary | ICD-10-CM | POA: Diagnosis not present

## 2019-11-22 DIAGNOSIS — Z419 Encounter for procedure for purposes other than remedying health state, unspecified: Secondary | ICD-10-CM | POA: Diagnosis not present

## 2019-12-23 DIAGNOSIS — Z419 Encounter for procedure for purposes other than remedying health state, unspecified: Secondary | ICD-10-CM | POA: Diagnosis not present

## 2019-12-29 DIAGNOSIS — Z03818 Encounter for observation for suspected exposure to other biological agents ruled out: Secondary | ICD-10-CM | POA: Diagnosis not present

## 2019-12-29 DIAGNOSIS — Z1152 Encounter for screening for COVID-19: Secondary | ICD-10-CM | POA: Diagnosis not present

## 2020-01-22 DIAGNOSIS — Z419 Encounter for procedure for purposes other than remedying health state, unspecified: Secondary | ICD-10-CM | POA: Diagnosis not present

## 2020-02-22 DIAGNOSIS — Z419 Encounter for procedure for purposes other than remedying health state, unspecified: Secondary | ICD-10-CM | POA: Diagnosis not present

## 2020-03-23 DIAGNOSIS — Z419 Encounter for procedure for purposes other than remedying health state, unspecified: Secondary | ICD-10-CM | POA: Diagnosis not present

## 2020-04-23 DIAGNOSIS — Z419 Encounter for procedure for purposes other than remedying health state, unspecified: Secondary | ICD-10-CM | POA: Diagnosis not present

## 2020-04-27 DIAGNOSIS — Z1152 Encounter for screening for COVID-19: Secondary | ICD-10-CM | POA: Diagnosis not present

## 2020-04-27 DIAGNOSIS — Z03818 Encounter for observation for suspected exposure to other biological agents ruled out: Secondary | ICD-10-CM | POA: Diagnosis not present

## 2020-05-24 DIAGNOSIS — Z419 Encounter for procedure for purposes other than remedying health state, unspecified: Secondary | ICD-10-CM | POA: Diagnosis not present

## 2020-06-21 DIAGNOSIS — Z419 Encounter for procedure for purposes other than remedying health state, unspecified: Secondary | ICD-10-CM | POA: Diagnosis not present

## 2020-07-22 DIAGNOSIS — Z419 Encounter for procedure for purposes other than remedying health state, unspecified: Secondary | ICD-10-CM | POA: Diagnosis not present

## 2020-07-26 DIAGNOSIS — M25511 Pain in right shoulder: Secondary | ICD-10-CM | POA: Diagnosis not present

## 2020-07-26 DIAGNOSIS — Z6834 Body mass index (BMI) 34.0-34.9, adult: Secondary | ICD-10-CM | POA: Diagnosis not present

## 2020-07-26 DIAGNOSIS — E039 Hypothyroidism, unspecified: Secondary | ICD-10-CM | POA: Diagnosis not present

## 2020-07-26 DIAGNOSIS — M898X1 Other specified disorders of bone, shoulder: Secondary | ICD-10-CM | POA: Diagnosis not present

## 2020-08-21 DIAGNOSIS — Z419 Encounter for procedure for purposes other than remedying health state, unspecified: Secondary | ICD-10-CM | POA: Diagnosis not present

## 2020-09-21 DIAGNOSIS — Z419 Encounter for procedure for purposes other than remedying health state, unspecified: Secondary | ICD-10-CM | POA: Diagnosis not present

## 2020-10-21 DIAGNOSIS — Z419 Encounter for procedure for purposes other than remedying health state, unspecified: Secondary | ICD-10-CM | POA: Diagnosis not present

## 2020-11-21 DIAGNOSIS — Z419 Encounter for procedure for purposes other than remedying health state, unspecified: Secondary | ICD-10-CM | POA: Diagnosis not present

## 2020-11-28 DIAGNOSIS — M79642 Pain in left hand: Secondary | ICD-10-CM | POA: Diagnosis not present

## 2020-12-16 DIAGNOSIS — E039 Hypothyroidism, unspecified: Secondary | ICD-10-CM | POA: Diagnosis not present

## 2020-12-16 DIAGNOSIS — Z6834 Body mass index (BMI) 34.0-34.9, adult: Secondary | ICD-10-CM | POA: Diagnosis not present

## 2020-12-22 DIAGNOSIS — Z419 Encounter for procedure for purposes other than remedying health state, unspecified: Secondary | ICD-10-CM | POA: Diagnosis not present

## 2020-12-23 DIAGNOSIS — R635 Abnormal weight gain: Secondary | ICD-10-CM | POA: Diagnosis not present

## 2020-12-23 DIAGNOSIS — E039 Hypothyroidism, unspecified: Secondary | ICD-10-CM | POA: Diagnosis not present

## 2020-12-23 DIAGNOSIS — Z0000 Encounter for general adult medical examination without abnormal findings: Secondary | ICD-10-CM | POA: Diagnosis not present

## 2020-12-23 DIAGNOSIS — Z6834 Body mass index (BMI) 34.0-34.9, adult: Secondary | ICD-10-CM | POA: Diagnosis not present

## 2020-12-23 DIAGNOSIS — L910 Hypertrophic scar: Secondary | ICD-10-CM | POA: Diagnosis not present

## 2021-01-18 ENCOUNTER — Telehealth: Payer: Self-pay | Admitting: Obstetrics and Gynecology

## 2021-01-18 NOTE — Telephone Encounter (Signed)
Reached out to the pt to reschedule the October 18th appt. With Dr. Jean Rosenthal.   Left a message for pt to call back.  Pt does not have MyChart set up.

## 2021-01-20 NOTE — Telephone Encounter (Signed)
2nd attempt to reach pt to reschedule appt on 10/18 with SDJ.

## 2021-01-21 DIAGNOSIS — Z419 Encounter for procedure for purposes other than remedying health state, unspecified: Secondary | ICD-10-CM | POA: Diagnosis not present

## 2021-02-07 ENCOUNTER — Ambulatory Visit: Payer: Medicaid Other | Admitting: Obstetrics and Gynecology

## 2021-02-21 DIAGNOSIS — Z419 Encounter for procedure for purposes other than remedying health state, unspecified: Secondary | ICD-10-CM | POA: Diagnosis not present

## 2021-02-24 ENCOUNTER — Ambulatory Visit: Payer: Medicaid Other | Admitting: Obstetrics and Gynecology

## 2021-03-23 DIAGNOSIS — Z419 Encounter for procedure for purposes other than remedying health state, unspecified: Secondary | ICD-10-CM | POA: Diagnosis not present

## 2021-04-23 DIAGNOSIS — Z419 Encounter for procedure for purposes other than remedying health state, unspecified: Secondary | ICD-10-CM | POA: Diagnosis not present

## 2021-04-25 ENCOUNTER — Ambulatory Visit: Payer: Medicaid Other | Admitting: Obstetrics and Gynecology

## 2021-05-24 DIAGNOSIS — Z419 Encounter for procedure for purposes other than remedying health state, unspecified: Secondary | ICD-10-CM | POA: Diagnosis not present

## 2021-06-08 DIAGNOSIS — E039 Hypothyroidism, unspecified: Secondary | ICD-10-CM | POA: Diagnosis not present

## 2021-06-08 DIAGNOSIS — E538 Deficiency of other specified B group vitamins: Secondary | ICD-10-CM | POA: Diagnosis not present

## 2021-06-08 DIAGNOSIS — R7989 Other specified abnormal findings of blood chemistry: Secondary | ICD-10-CM | POA: Diagnosis not present

## 2021-06-08 DIAGNOSIS — Z6833 Body mass index (BMI) 33.0-33.9, adult: Secondary | ICD-10-CM | POA: Diagnosis not present

## 2021-06-08 DIAGNOSIS — R790 Abnormal level of blood mineral: Secondary | ICD-10-CM | POA: Diagnosis not present

## 2021-06-21 DIAGNOSIS — Z419 Encounter for procedure for purposes other than remedying health state, unspecified: Secondary | ICD-10-CM | POA: Diagnosis not present

## 2021-07-22 DIAGNOSIS — Z419 Encounter for procedure for purposes other than remedying health state, unspecified: Secondary | ICD-10-CM | POA: Diagnosis not present

## 2021-08-21 DIAGNOSIS — Z419 Encounter for procedure for purposes other than remedying health state, unspecified: Secondary | ICD-10-CM | POA: Diagnosis not present

## 2021-09-21 DIAGNOSIS — Z419 Encounter for procedure for purposes other than remedying health state, unspecified: Secondary | ICD-10-CM | POA: Diagnosis not present

## 2021-09-29 DIAGNOSIS — E538 Deficiency of other specified B group vitamins: Secondary | ICD-10-CM | POA: Diagnosis not present

## 2021-09-29 DIAGNOSIS — E039 Hypothyroidism, unspecified: Secondary | ICD-10-CM | POA: Diagnosis not present

## 2021-09-29 DIAGNOSIS — Z6833 Body mass index (BMI) 33.0-33.9, adult: Secondary | ICD-10-CM | POA: Diagnosis not present

## 2021-10-16 DIAGNOSIS — E039 Hypothyroidism, unspecified: Secondary | ICD-10-CM | POA: Diagnosis not present

## 2021-10-16 DIAGNOSIS — M25511 Pain in right shoulder: Secondary | ICD-10-CM | POA: Diagnosis not present

## 2021-10-16 DIAGNOSIS — E538 Deficiency of other specified B group vitamins: Secondary | ICD-10-CM | POA: Diagnosis not present

## 2021-10-16 DIAGNOSIS — R635 Abnormal weight gain: Secondary | ICD-10-CM | POA: Diagnosis not present

## 2021-10-16 DIAGNOSIS — G8929 Other chronic pain: Secondary | ICD-10-CM | POA: Diagnosis not present

## 2021-10-16 DIAGNOSIS — Z6835 Body mass index (BMI) 35.0-35.9, adult: Secondary | ICD-10-CM | POA: Diagnosis not present

## 2021-10-20 DIAGNOSIS — M79671 Pain in right foot: Secondary | ICD-10-CM | POA: Diagnosis not present

## 2021-10-20 DIAGNOSIS — M25571 Pain in right ankle and joints of right foot: Secondary | ICD-10-CM | POA: Diagnosis not present

## 2021-10-21 DIAGNOSIS — Z419 Encounter for procedure for purposes other than remedying health state, unspecified: Secondary | ICD-10-CM | POA: Diagnosis not present

## 2021-11-21 DIAGNOSIS — Z419 Encounter for procedure for purposes other than remedying health state, unspecified: Secondary | ICD-10-CM | POA: Diagnosis not present

## 2021-12-13 DIAGNOSIS — M79671 Pain in right foot: Secondary | ICD-10-CM | POA: Diagnosis not present

## 2021-12-22 DIAGNOSIS — Z419 Encounter for procedure for purposes other than remedying health state, unspecified: Secondary | ICD-10-CM | POA: Diagnosis not present

## 2022-01-19 DIAGNOSIS — Z03818 Encounter for observation for suspected exposure to other biological agents ruled out: Secondary | ICD-10-CM | POA: Diagnosis not present

## 2022-01-19 DIAGNOSIS — Z20822 Contact with and (suspected) exposure to covid-19: Secondary | ICD-10-CM | POA: Diagnosis not present

## 2022-01-21 DIAGNOSIS — Z419 Encounter for procedure for purposes other than remedying health state, unspecified: Secondary | ICD-10-CM | POA: Diagnosis not present

## 2022-02-15 DIAGNOSIS — R7989 Other specified abnormal findings of blood chemistry: Secondary | ICD-10-CM | POA: Diagnosis not present

## 2022-02-15 DIAGNOSIS — G8929 Other chronic pain: Secondary | ICD-10-CM | POA: Diagnosis not present

## 2022-02-15 DIAGNOSIS — E538 Deficiency of other specified B group vitamins: Secondary | ICD-10-CM | POA: Diagnosis not present

## 2022-02-15 DIAGNOSIS — D649 Anemia, unspecified: Secondary | ICD-10-CM | POA: Diagnosis not present

## 2022-02-15 DIAGNOSIS — M25511 Pain in right shoulder: Secondary | ICD-10-CM | POA: Diagnosis not present

## 2022-02-15 DIAGNOSIS — E039 Hypothyroidism, unspecified: Secondary | ICD-10-CM | POA: Diagnosis not present

## 2022-02-15 DIAGNOSIS — R635 Abnormal weight gain: Secondary | ICD-10-CM | POA: Diagnosis not present

## 2022-02-19 DIAGNOSIS — Z6836 Body mass index (BMI) 36.0-36.9, adult: Secondary | ICD-10-CM | POA: Diagnosis not present

## 2022-02-19 DIAGNOSIS — D649 Anemia, unspecified: Secondary | ICD-10-CM | POA: Diagnosis not present

## 2022-02-19 DIAGNOSIS — E039 Hypothyroidism, unspecified: Secondary | ICD-10-CM | POA: Diagnosis not present

## 2022-02-19 DIAGNOSIS — R790 Abnormal level of blood mineral: Secondary | ICD-10-CM | POA: Diagnosis not present

## 2022-02-19 DIAGNOSIS — M25511 Pain in right shoulder: Secondary | ICD-10-CM | POA: Diagnosis not present

## 2022-02-19 DIAGNOSIS — R7989 Other specified abnormal findings of blood chemistry: Secondary | ICD-10-CM | POA: Diagnosis not present

## 2022-02-19 DIAGNOSIS — Z0000 Encounter for general adult medical examination without abnormal findings: Secondary | ICD-10-CM | POA: Diagnosis not present

## 2022-02-19 DIAGNOSIS — Z1331 Encounter for screening for depression: Secondary | ICD-10-CM | POA: Diagnosis not present

## 2022-02-19 DIAGNOSIS — G8929 Other chronic pain: Secondary | ICD-10-CM | POA: Diagnosis not present

## 2022-02-19 DIAGNOSIS — M545 Low back pain, unspecified: Secondary | ICD-10-CM | POA: Diagnosis not present

## 2022-02-21 DIAGNOSIS — Z419 Encounter for procedure for purposes other than remedying health state, unspecified: Secondary | ICD-10-CM | POA: Diagnosis not present

## 2022-03-23 DIAGNOSIS — Z419 Encounter for procedure for purposes other than remedying health state, unspecified: Secondary | ICD-10-CM | POA: Diagnosis not present

## 2022-04-23 DIAGNOSIS — J069 Acute upper respiratory infection, unspecified: Secondary | ICD-10-CM | POA: Diagnosis not present

## 2022-04-23 DIAGNOSIS — Z419 Encounter for procedure for purposes other than remedying health state, unspecified: Secondary | ICD-10-CM | POA: Diagnosis not present

## 2022-05-24 DIAGNOSIS — Z419 Encounter for procedure for purposes other than remedying health state, unspecified: Secondary | ICD-10-CM | POA: Diagnosis not present

## 2022-06-22 DIAGNOSIS — Z419 Encounter for procedure for purposes other than remedying health state, unspecified: Secondary | ICD-10-CM | POA: Diagnosis not present

## 2022-06-25 DIAGNOSIS — Z6836 Body mass index (BMI) 36.0-36.9, adult: Secondary | ICD-10-CM | POA: Diagnosis not present

## 2022-06-25 DIAGNOSIS — J208 Acute bronchitis due to other specified organisms: Secondary | ICD-10-CM | POA: Diagnosis not present

## 2022-06-25 DIAGNOSIS — R509 Fever, unspecified: Secondary | ICD-10-CM | POA: Diagnosis not present

## 2022-06-25 DIAGNOSIS — E039 Hypothyroidism, unspecified: Secondary | ICD-10-CM | POA: Diagnosis not present

## 2022-06-25 DIAGNOSIS — Z03818 Encounter for observation for suspected exposure to other biological agents ruled out: Secondary | ICD-10-CM | POA: Diagnosis not present

## 2022-06-25 DIAGNOSIS — M791 Myalgia, unspecified site: Secondary | ICD-10-CM | POA: Diagnosis not present

## 2022-06-25 DIAGNOSIS — R7309 Other abnormal glucose: Secondary | ICD-10-CM | POA: Diagnosis not present

## 2022-06-25 DIAGNOSIS — R7989 Other specified abnormal findings of blood chemistry: Secondary | ICD-10-CM | POA: Diagnosis not present

## 2022-06-25 DIAGNOSIS — R531 Weakness: Secondary | ICD-10-CM | POA: Diagnosis not present

## 2022-07-23 DIAGNOSIS — Z419 Encounter for procedure for purposes other than remedying health state, unspecified: Secondary | ICD-10-CM | POA: Diagnosis not present

## 2022-08-28 ENCOUNTER — Telehealth: Payer: Self-pay

## 2022-08-28 NOTE — Telephone Encounter (Signed)
LVM for patient to call back. AS, CMA
